# Patient Record
Sex: Female | Born: 1980 | Race: White | Hispanic: No | Smoking: Never smoker
Health system: Southern US, Community
[De-identification: ages and names within clinical notes are randomized; demographics above are authoritative.]

## PROBLEM LIST (undated history)

## (undated) DIAGNOSIS — C569 Malignant neoplasm of unspecified ovary: Secondary | ICD-10-CM

## (undated) DIAGNOSIS — C801 Malignant (primary) neoplasm, unspecified: Secondary | ICD-10-CM

## (undated) DIAGNOSIS — F32A Depression, unspecified: Secondary | ICD-10-CM

## (undated) DIAGNOSIS — F329 Major depressive disorder, single episode, unspecified: Secondary | ICD-10-CM

## (undated) DIAGNOSIS — R1013 Epigastric pain: Secondary | ICD-10-CM

## (undated) DIAGNOSIS — K219 Gastro-esophageal reflux disease without esophagitis: Secondary | ICD-10-CM

## (undated) DIAGNOSIS — F419 Anxiety disorder, unspecified: Secondary | ICD-10-CM

## (undated) DIAGNOSIS — G4730 Sleep apnea, unspecified: Secondary | ICD-10-CM

## (undated) HISTORY — DX: Major depressive disorder, single episode, unspecified: F32.9

## (undated) HISTORY — DX: Depression, unspecified: F32.A

## (undated) HISTORY — DX: Epigastric pain: R10.13

## (undated) HISTORY — PX: ABDOMINAL HYSTERECTOMY: SHX81

## (undated) HISTORY — DX: Malignant (primary) neoplasm, unspecified: C80.1

## (undated) HISTORY — DX: Sleep apnea, unspecified: G47.30

## (undated) HISTORY — DX: Anxiety disorder, unspecified: F41.9

## (undated) HISTORY — PX: HERNIA REPAIR: SHX51

## (undated) HISTORY — PX: APPENDECTOMY: SHX54

## (undated) HISTORY — DX: Malignant neoplasm of unspecified ovary: C56.9

## (undated) HISTORY — DX: Gastro-esophageal reflux disease without esophagitis: K21.9

---
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
## 2000-03-27 ENCOUNTER — Encounter: Admission: RE | Admit: 2000-03-27 | Discharge: 2000-06-25 | Payer: Self-pay | Admitting: Orthopaedic Surgery

## 2003-01-22 ENCOUNTER — Encounter: Payer: Self-pay | Admitting: Unknown Physician Specialty

## 2003-01-22 ENCOUNTER — Ambulatory Visit (HOSPITAL_COMMUNITY): Admission: RE | Admit: 2003-01-22 | Discharge: 2003-01-22 | Payer: Self-pay | Admitting: Unknown Physician Specialty

## 2004-08-08 ENCOUNTER — Ambulatory Visit (HOSPITAL_COMMUNITY): Payer: Self-pay | Admitting: Psychiatry

## 2004-11-21 ENCOUNTER — Ambulatory Visit: Payer: Self-pay | Admitting: Psychiatry

## 2004-11-21 ENCOUNTER — Ambulatory Visit (HOSPITAL_COMMUNITY): Payer: Self-pay | Admitting: Psychiatry

## 2005-01-18 ENCOUNTER — Encounter: Payer: Self-pay | Admitting: Cardiology

## 2005-03-01 ENCOUNTER — Ambulatory Visit: Payer: Self-pay | Admitting: Psychiatry

## 2005-03-01 ENCOUNTER — Ambulatory Visit (HOSPITAL_COMMUNITY): Payer: Self-pay | Admitting: Psychiatry

## 2005-05-17 ENCOUNTER — Ambulatory Visit (HOSPITAL_COMMUNITY): Payer: Self-pay | Admitting: Psychiatry

## 2005-09-13 ENCOUNTER — Ambulatory Visit (HOSPITAL_COMMUNITY): Payer: Self-pay | Admitting: Physician Assistant

## 2005-10-13 ENCOUNTER — Ambulatory Visit (HOSPITAL_COMMUNITY): Payer: Self-pay | Admitting: Psychiatry

## 2005-12-13 ENCOUNTER — Ambulatory Visit (HOSPITAL_COMMUNITY): Payer: Self-pay | Admitting: Psychiatry

## 2006-03-15 ENCOUNTER — Ambulatory Visit (HOSPITAL_COMMUNITY): Payer: Self-pay | Admitting: Psychiatry

## 2006-06-08 ENCOUNTER — Ambulatory Visit (HOSPITAL_COMMUNITY): Payer: Self-pay | Admitting: Psychiatry

## 2006-09-19 ENCOUNTER — Ambulatory Visit (HOSPITAL_COMMUNITY): Payer: Self-pay | Admitting: Psychiatry

## 2006-10-22 ENCOUNTER — Ambulatory Visit (HOSPITAL_COMMUNITY): Payer: Self-pay | Admitting: Psychiatry

## 2007-01-24 ENCOUNTER — Ambulatory Visit (HOSPITAL_COMMUNITY): Payer: Self-pay | Admitting: Psychiatry

## 2007-05-01 ENCOUNTER — Ambulatory Visit (HOSPITAL_COMMUNITY): Payer: Self-pay | Admitting: Psychiatry

## 2007-08-02 ENCOUNTER — Ambulatory Visit (HOSPITAL_COMMUNITY): Payer: Self-pay | Admitting: Psychiatry

## 2008-12-30 ENCOUNTER — Ambulatory Visit (HOSPITAL_COMMUNITY): Payer: Self-pay | Admitting: Psychiatry

## 2009-05-11 ENCOUNTER — Ambulatory Visit (HOSPITAL_COMMUNITY): Payer: Self-pay | Admitting: Psychiatry

## 2009-08-06 ENCOUNTER — Ambulatory Visit (HOSPITAL_COMMUNITY): Payer: Self-pay | Admitting: Psychiatry

## 2010-02-04 ENCOUNTER — Ambulatory Visit (HOSPITAL_COMMUNITY): Payer: Self-pay | Admitting: Psychiatry

## 2010-02-16 LAB — LIPID PANEL
Cholesterol: 158 mg/dL (ref 0–200)
HDL: 39 mg/dL (ref 35–70)
LDL Cholesterol: 86 mg/dL

## 2010-02-16 LAB — TSH: TSH: 0.98 u[IU]/mL (ref <=5.90)

## 2010-02-16 LAB — BASIC METABOLIC PANEL: Glucose: 90 mg/dL

## 2010-05-30 ENCOUNTER — Encounter: Payer: Self-pay | Admitting: Cardiology

## 2010-05-30 LAB — CBC AND DIFFERENTIAL
Hemoglobin: 12.1 g/dL (ref 12.0–16.0)
Platelets: 286 10*3/uL (ref 150–399)
WBC: 6.4 10^3/mL

## 2010-06-06 ENCOUNTER — Encounter: Payer: Self-pay | Admitting: Cardiology

## 2010-08-23 ENCOUNTER — Ambulatory Visit (HOSPITAL_COMMUNITY): Payer: Self-pay | Admitting: Psychiatry

## 2010-09-05 ENCOUNTER — Ambulatory Visit: Admit: 2010-09-05 | Payer: Self-pay | Admitting: Cardiology

## 2010-09-15 ENCOUNTER — Ambulatory Visit
Admission: RE | Admit: 2010-09-15 | Discharge: 2010-09-15 | Payer: Self-pay | Source: Home / Self Care | Attending: Cardiology | Admitting: Cardiology

## 2010-09-15 ENCOUNTER — Encounter: Payer: Self-pay | Admitting: Cardiology

## 2010-09-15 ENCOUNTER — Encounter (INDEPENDENT_AMBULATORY_CARE_PROVIDER_SITE_OTHER): Payer: Self-pay | Admitting: *Deleted

## 2010-09-15 DIAGNOSIS — F411 Generalized anxiety disorder: Secondary | ICD-10-CM | POA: Insufficient documentation

## 2010-09-15 DIAGNOSIS — K219 Gastro-esophageal reflux disease without esophagitis: Secondary | ICD-10-CM | POA: Insufficient documentation

## 2010-09-15 DIAGNOSIS — R002 Palpitations: Secondary | ICD-10-CM | POA: Insufficient documentation

## 2010-09-15 DIAGNOSIS — G4730 Sleep apnea, unspecified: Secondary | ICD-10-CM

## 2010-09-15 DIAGNOSIS — G4710 Hypersomnia, unspecified: Secondary | ICD-10-CM | POA: Insufficient documentation

## 2010-09-20 ENCOUNTER — Encounter: Payer: Self-pay | Admitting: Cardiology

## 2010-09-20 ENCOUNTER — Ambulatory Visit
Admission: RE | Admit: 2010-09-20 | Discharge: 2010-09-20 | Payer: Self-pay | Source: Home / Self Care | Attending: Cardiology | Admitting: Cardiology

## 2010-09-20 ENCOUNTER — Encounter (INDEPENDENT_AMBULATORY_CARE_PROVIDER_SITE_OTHER): Payer: Self-pay | Admitting: *Deleted

## 2010-09-21 ENCOUNTER — Telehealth (INDEPENDENT_AMBULATORY_CARE_PROVIDER_SITE_OTHER): Payer: Self-pay | Admitting: *Deleted

## 2010-09-23 ENCOUNTER — Encounter (INDEPENDENT_AMBULATORY_CARE_PROVIDER_SITE_OTHER): Payer: Self-pay | Admitting: *Deleted

## 2010-09-29 ENCOUNTER — Encounter: Payer: Self-pay | Admitting: Cardiology

## 2010-09-29 NOTE — Letter (Signed)
Summary: Work Writer at [employer]	[place]. [address]   [place], [place] [address]   Phone: [phone]  Fax: [phone]     September 15, 2010    Almira Bar   The above named patient had a medical visit today at:  [facility].  She will be having an Echo done on Tuesday, January 24 at 2:00 at [facility].  Immediatly following, she will be coming back to office to have a 48 hour holter moniter placed.  This monitor should not get wet.    Please take this into consideration when reviewing the time away from work/school.      Sincerely yours,  Architectural technologist

## 2010-09-29 NOTE — Letter (Signed)
Summary: External Correspondence/ [facility]  External Correspondence/ [facility]   Imported By: Dorise Hiss 09/15/2010 10:56:48  _____________________________________________________________________  External Attachment:    Type:   Image     Comment:   External Document

## 2010-09-29 NOTE — Letter (Signed)
Summary: External Correspondence/ [facility]  External Correspondence/ [facility]   Imported By: Dorise Hiss 08/18/2010 10:09:08  _____________________________________________________________________  External Attachment:    Type:   Image     Comment:   External Document

## 2010-09-29 NOTE — Letter (Signed)
Summary: External Correspondence/ [facility]  External Correspondence/ [facility]   Imported By: Dorise Hiss 09/15/2010 10:58:29  _____________________________________________________________________  External Attachment:    Type:   Image     Comment:   External Document

## 2010-09-29 NOTE — Letter (Signed)
Summary: Eden Results Veterinary surgeon at [facility]. [address]   [place], [place] [address]   Phone: [phone]  Fax: [phone]        September 23, 2010 MRN: [id number]    [facility] [address] [place], [place]  [address]   Dear Ms. Bacha,  Your test ordered by Selena Batten has been reviewed by your physician (or physician assistant) and was found to be normal or stable. Your physician (or physician assistant) felt no changes were needed at this time.  __X__ Echocardiogram  ____ Cardiac Stress Test  ____ Lab Work  ____ Peripheral vascular study of arms, legs or neck  ____ CT scan or X-ray  ____ Lung or Breathing test  ____ Other:   Thank you.   Cyril Loosen, RN, BSN    Duane Boston, M.D., F.A.C.C. Thressa Sheller, M.D., F.A.C.C. Oneal Grout, M.D., F.A.C.C. Cheree Ditto, M.D., F.A.C.C. Daiva Nakayama, M.D., F.A.C.C. Kenney Houseman, M.D., F.A.C.C. Jeanne Ivan, PA-C

## 2010-09-29 NOTE — Assessment & Plan Note (Signed)
Summary: NP-PALPITATIONS   Visit Type:  Initial Consult Primary Provider:  Dr. Kyra Manges   History of Present Illness: 30 year old woman referred for cardiology consultation.  Faxed copy of ECG from October 2011 shows a sinus rhythm with normal intervals.  Patient presents with no prior history of heart disease, no prior diagnostic testing. She also presents with no known cardiac risk factors.  Her complaint is that of new onset, daily palpitations, at times referred to as "fluttering", with some associated dyspnea, but no chest pain or presyncope. She does feel somewhat fatigued, afterwards. These episodes occur with or without activity, are brief in duration, but recurrent throughout the day.  Of note, she admits to imbibing a significant amount of [facility] daily, perhaps 40 ounces, as well as drinking sweet tea.  Preventive Screening-Counseling & Management  Alcohol-Tobacco     Smoking Status: never  Current Medications (verified): 1)  Effexor Xr 150 Mg Xr24h-Cap (Venlafaxine Hcl) .... Take 1 Tablet By Mouth Once A Day  Allergies (verified): 1)  ! Codeine  Past History:  Family History: Last updated: 09/15/2010 negative for premature CAD  Past Medical History: Anxiety disorder G E R D  Family History: negative for premature CAD  Social History: Smoking Status:  never  Review of Systems       No fevers, chills, hemoptysis, dysphagia, melena, hematocheezia, hematuria, rash, claudication, orthopnea, pnd, pedal edema. All other systems negative.   Vital Signs:  Patient profile:   30 year old female Height:      66 inches Weight:      200 pounds BMI:     32.40 Pulse rate:   98 / minute BP sitting:   119 / 76  (left arm) Cuff size:   regular  Vitals Entered By: Hoover Brunette, LPN (September 15, 2010 2:52 PM)  Nutrition Counseling: Patient's BMI is greater than 25 and therefore counseled on weight management options.  Is Patient Diabetic? No Comments  palpitations x 2 month off/on-   Does notice that it seems to calm down if she drinks glass of wine at bedtime.  But will somethimes wake up in middle of night.  Has also tried cutting back on caffeine.   Was having alot of reflux.  Did have upper endoscopy x 1 month ago & was told normal.  EKG scanned in from [place].     Physical Exam  Additional Exam:  GEN: 30 year old female, moderately obese, no distress HEENT: NCAT,PERRLA,EOMI NECK: palpable pulses, no bruits; no JVD; no TM LUNGS: CTA bilaterally HEART: RRR (S1S2); no significant murmurs; no rubs; no gallops ABD: soft, NT; intact BS EXT: intact distal pulses; no edema SKIN: warm, dry MUSC: no obvious deformity NEURO: A/O (x3)     EKG  Procedure date:  06/06/2010  Findings:      a 12-lead EKG from [facility] indicates NSR at 78 bpm; normal axis; no ischemic changes  Impression & Recommendations:  Problem # 1:  PALPITATIONS (ICD-785.1)  Will evaluate further with a baseline 2-D echocardiogram for assessment of LV function, and rule out of any underlying structural abnormalities. We'll also order a 48 hour Holter monitor to rule out dysrhythmia. We suspect that she most likely has frequent PVCs, or perhaps PACs. We also strongly recommend that she significantly curtail her caffeinated beverage intake. We'll also check a baseline metabolic profile, as well as a TSH level. We'll then arrange for early clinic followup with myself and Dr. Diona Browner, within the next few weeks, for review of  these results and further recommendations.  Orders: 2-D Echocardiogram (2D Echo) Holter Monitor (Holter Monitor) T-Basic Metabolic Panel [phone]) T-TSH [phone])  Problem # 2:  ANXIETY DISORDER (ICD-300.00) Assessment: Comment Only  Problem # 3:  ESOPHAGEAL REFLUX (ICD-530.81) Assessment: Comment Only  Patient Instructions: 1)  Follow up with Dr. Diona Browner on Thursday, October 20, 2010 at 3pm. 2)  Your physician has  requested that you have an echocardiogram.  Echocardiography is a painless test that uses sound waves to create images of your heart. It provides your doctor with information about the size and shape of your heart and how well your heart's chambers and valves are working.  This procedure takes approximately one hour. There are no restrictions for this procedure. 3)  Your physician has recommended that you wear a holter monitor.  Holter monitors are medical devices that record the heart's electrical activity. Doctors most often use these monitors to diagnose arrhythmias. Arrhythmias are problems with the speed or rhythm of the heartbeat. The monitor is a small, portable device. You can wear one while you do your normal daily activities. This is usually used to diagnose what is causing palpitations/syncope (passing out). 4)  Your physician recommends that you go to the [facility] for lab work.

## 2010-09-29 NOTE — Progress Notes (Signed)
Summary: Requesting work note  Phone Note Call from Patient Call back at [employer] [phone] Call back at [phone]   Summary of Call: Pt called to ask if she could get a note excusing her from work for Thursday and Friday. She states she is wearing holter. 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 She states even if she works on the floor the monitor will get wet because she sweats like a pig. Pt was given a note from [place] stating that she will be wearing a monitor that cannot get wet. Pt notified that sweat should not damage the monitor. Pt notified as long as the monitor is not submersed in water it will be okay. She was notified to keep monitor in the case and keep as dry as possible. Pt verbalized understanding.  Initial call taken by: Cyril Loosen, RN, BSN,  September 21, 2010 5:06 PM

## 2010-09-29 NOTE — Letter (Signed)
Summary: Eden Results Engineer, manufacturing at [facility]  [address]   [place], [place] [address]   Phone: [phone]  Fax: [phone]        September 20, 2010 MRN: [id number]    [facility] [address] [place], [place]  [address]    Dear Ms. Brackney,  Your test ordered by Selena Batten has been reviewed by your physician (or physician assistant) and was found to be normal or stable. Your physician (or physician assistant) felt no changes were needed at this time.  ____ Echocardiogram  ____ Cardiac Stress Test  __X__ Lab Work  ____ Peripheral vascular study of arms, legs or neck  ____ CT scan or X-ray  ____ Lung or Breathing test  ____ Other:   Thank you.   Cyril Loosen, RN, BSN    Duane Boston, M.D., F.A.C.C. Thressa Sheller, M.D., F.A.C.C. Oneal Grout, M.D., F.A.C.C. Cheree Ditto, M.D., F.A.C.C. Daiva Nakayama, M.D., F.A.C.C. Kenney Houseman, M.D., F.A.C.C. Jeanne Ivan, PA-C

## 2010-10-05 NOTE — Procedures (Addendum)
Summary: Holter Final Report  Holter Final Report   Imported By: Cyril Loosen, RN, BSN 09/29/2010 10:27:17  _____________________________________________________________________  External Attachment:    Type:   Image     Comment:   External Document  Appended Document: Holter Final Report Pt called today asking for results of monitor. Pt was to be notified at 2/23 office visit but no showed for this appt. She states it costs $40 to come for appt and didn't want to pay if everything was okay. Notified of results. Pt will contact our office if she feels she needs further f/u in the future.

## 2010-10-20 ENCOUNTER — Ambulatory Visit: Payer: Self-pay | Admitting: Cardiology

## 2010-11-15 ENCOUNTER — Encounter: Payer: Self-pay | Admitting: *Deleted

## 2010-11-15 DIAGNOSIS — F419 Anxiety disorder, unspecified: Secondary | ICD-10-CM

## 2010-11-29 ENCOUNTER — Telehealth: Payer: Self-pay | Admitting: *Deleted

## 2010-11-29 NOTE — Telephone Encounter (Signed)
Pt left message on voicemail asking for return call. She did not give a reason for calling.  Attempted to reach pt. Left message at [phone] for pt to return call. No answer at [phone].

## 2010-12-06 NOTE — Telephone Encounter (Signed)
Left message to call back on voice mail

## 2010-12-15 NOTE — Telephone Encounter (Signed)
Pt left original message asking for call. Attempted to reach pt several times. Pt never returned call.

## 2011-03-02 ENCOUNTER — Encounter: Payer: Self-pay | Admitting: Physician Assistant

## 2012-09-27 ENCOUNTER — Ambulatory Visit (HOSPITAL_COMMUNITY): Payer: Self-pay | Admitting: Psychiatry

## 2012-11-22 ENCOUNTER — Ambulatory Visit: Payer: Self-pay | Admitting: Physician Assistant

## 2012-11-22 ENCOUNTER — Ambulatory Visit: Payer: Self-pay | Admitting: General Practice

## 2012-11-22 ENCOUNTER — Ambulatory Visit: Payer: Self-pay | Admitting: Family Medicine

## 2012-12-02 ENCOUNTER — Other Ambulatory Visit: Payer: Self-pay | Admitting: Nurse Practitioner

## 2012-12-02 NOTE — Telephone Encounter (Signed)
Last filled 11/05/12, last seen 08/16/12. If approved, [facility]

## 2012-12-02 NOTE — Telephone Encounter (Signed)
Call in Rx for xanax please

## 2012-12-02 NOTE — Telephone Encounter (Signed)
[facility]

## 2012-12-04 ENCOUNTER — Telehealth: Payer: Self-pay | Admitting: Family Medicine

## 2012-12-04 NOTE — Telephone Encounter (Signed)
APPT MADE

## 2012-12-05 ENCOUNTER — Encounter: Payer: Self-pay | Admitting: Family Medicine

## 2012-12-05 ENCOUNTER — Ambulatory Visit (INDEPENDENT_AMBULATORY_CARE_PROVIDER_SITE_OTHER): Payer: Managed Care, Other (non HMO) | Admitting: Family Medicine

## 2012-12-05 VITALS — BP 128/80 | HR 84 | Temp 97.2°F | Resp 18 | Ht 65.0 in | Wt 214.8 lb

## 2012-12-05 DIAGNOSIS — R6889 Other general symptoms and signs: Secondary | ICD-10-CM | POA: Insufficient documentation

## 2012-12-05 DIAGNOSIS — R5383 Other fatigue: Secondary | ICD-10-CM | POA: Insufficient documentation

## 2012-12-05 DIAGNOSIS — R5381 Other malaise: Secondary | ICD-10-CM

## 2012-12-05 DIAGNOSIS — F411 Generalized anxiety disorder: Secondary | ICD-10-CM | POA: Insufficient documentation

## 2012-12-05 DIAGNOSIS — K219 Gastro-esophageal reflux disease without esophagitis: Secondary | ICD-10-CM | POA: Insufficient documentation

## 2012-12-05 LAB — POCT CBC
Granulocyte percent: 70.4 %G (ref 37–80)
HCT, POC: 38 % (ref 37.7–47.9)
Hemoglobin: 13.2 g/dL (ref 12.2–16.2)
Lymph, poc: 2.2 (ref 0.6–3.4)
MCH, POC: 28.7 pg (ref 27–31.2)
MCHC: 34.6 g/dL (ref 31.8–35.4)
MCV: 82.9 fL (ref 80–97)
MPV: 7.5 fL (ref 0–99.8)
POC Granulocyte: 7 — AB (ref 2–6.9)
POC LYMPH PERCENT: 22.2 %L (ref 10–50)
Platelet Count, POC: 285 10*3/uL (ref 142–424)
RBC: 4.6 M/uL (ref 4.04–5.48)
RDW, POC: 15.2 %
WBC: 10 10*3/uL (ref 4.6–10.2)

## 2012-12-05 MED ORDER — VORTIOXETINE HBR 10 MG PO TABS: 10.0000 mg | ORAL_TABLET | Freq: Every day | ORAL | Status: DC

## 2012-12-05 MED ORDER — OMEPRAZOLE 40 MG PO CPDR: 40.0000 mg | DELAYED_RELEASE_CAPSULE | Freq: Every day | ORAL | Status: DC

## 2012-12-05 NOTE — Patient Instructions (Addendum)

## 2012-12-05 NOTE — Progress Notes (Signed)
Patient ID: Mary Cox, female   DOB: 08/20/1981, 32 y.o.   MRN: [id number] SUBJECTIVE:   HPI: Patient comes in for evaluation of cold intolerance, especially her right side. Of the  body. Also, she would like a different antidepressant. Thinks it has side effects. These are headaches. In malaise. 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 She has her family. She is married with a child. Her pedal anxiety is that the cancer would come back. She is been cancer free for 2-3 years. She has been doing well. He gets anxious easily ever since the surgery. Has had to use Xanax. She would like a different antidepressant.  Chest lateral reflux. Sometimes she number fluid in her throat.  PMH/PSH: reviewed/updated in Epic  SH/FH: reviewed/updated in Epic  Allergies: reviewed/updated in Epic  Medications: reviewed/updated in Epic  Immunizations: reviewed/updated in Epic  ROS: As above in the HPI. All other systems are stable or negative.  OBJECTIVE:    On examination she appeared in good health and spirits. Obese Vital signs as documented. BP 128/80  Pulse 84  Temp(Src) 97.2 F (36.2 C)  Resp 18  Ht 5\' 5"  (1.651 m)  Wt 214 lb 12.8 oz (97.433 kg)  BMI 35.74 kg/m2  SpO2 99%  Skin warm and dry and without overt rashes.  Head, Eyes, Ears, throat: normal Neck without JVD.  Lungs clear.  Heart exam notable for regular rhythm, normal sounds and absence of murmurs, rubs or gallops. Abdomen unremarkable and without evidence of organomegaly, masses, or abdominal aortic enlargement.  xtremities nonedematous. Neurologic: nonfocal  ASSESSMENT:  Other malaise and fatigue - Plan: POCT CBC, COMPLETE METABOLIC PANEL WITH GFR, Vitamin B12, Thyroid Panel With TSH, Vitamin D 25 hydroxy, Folate  Cold intolerance - Plan: POCT CBC, COMPLETE METABOLIC PANEL WITH GFR, Vitamin B12, Thyroid Panel With TSH, Vitamin D 25 hydroxy, Folate  GERD (gastroesophageal reflux disease)  Generalized  anxiety disorder  her anxiety stems from her hysterectomy. Deep down some times, She thinks about the cancer coming back.   PLAN: Orders Placed This Encounter  Procedures  . COMPLETE METABOLIC PANEL WITH GFR  . Vitamin B12  . Thyroid Panel With TSH  . Vitamin D 25 hydroxy  . Folate  . POCT CBC   Results for orders placed in visit on 12/05/12 (from the past 24 hour(s))  POCT CBC     Status: Abnormal   Collection Time    12/05/12  5:06 PM      Result Value Range   WBC 10.0  4.6 - 10.2 K/uL   Lymph, poc 2.2  0.6 - 3.4   POC LYMPH PERCENT 22.2  10 - 50 %L   MID (cbc)    0 - 0.9   POC MID %    0 - 12 %M   POC Granulocyte 7.0 (*) 2 - 6.9   Granulocyte percent 70.4  37 - 80 %G   RBC 4.6  4.04 - 5.48 M/uL   Hemoglobin 13.2  12.2 - 16.2 g/dL   HCT, POC 08.6  57.8 - 47.9 %   MCV 82.9  80 - 97 fL   MCH, POC 28.7  27 - 31.2 pg   MCHC 34.6  31.8 - 35.4 g/dL   RDW, POC 46.9     Platelet Count, POC 285.0  142 - 424 K/uL   MPV 7.5  0 - 99.8 fL   Meds ordered this encounter  Medications  . ALPRAZolam (XANAX) 0.5 MG tablet  Sig: Take 0.5 mg by mouth at bedtime as needed for sleep.  Marland Kitchen DISCONTD: venlafaxine XR (EFFEXOR-XR) 150 MG 24 hr capsule    Sig: Take 150 mg by mouth daily.  . Vortioxetine HBr (BRINTELLIX) 10 MG TABS    Sig: Take 10 mg by mouth daily.    Dispense:  30 tablet    Refill:  2  . omeprazole (PRILOSEC) 40 MG capsule    Sig: Take 1 capsule (40 mg total) by mouth daily.    Dispense:  30 capsule    Refill:  3   supportive therapy. Counseled patient on finding her happiness in her life and in survival of the cancer. Await lab work. But there are trial of a different antidepressant.,gave her  four-week samples brintellix. Liver coupon part. Also prescribed BRINTELLIX in Epic.  Healthy balanced lifestyle and healthy eating discussed  Discussed GERD. Steps to take to elevate her bed. Dietary changes. Weight loss.   Elizar Alpern P. Modesto Charon, M.D.

## 2012-12-06 LAB — COMPLETE METABOLIC PANEL WITH GFR
ALT: 23 U/L (ref 0–35)
AST: 16 U/L (ref 0–37)
Albumin: 4.2 g/dL (ref 3.5–5.2)
Alkaline Phosphatase: 125 U/L — ABNORMAL HIGH (ref 39–117)
BUN: 8 mg/dL (ref 6–23)
CO2: 29 mEq/L (ref 19–32)
Calcium: 9.2 mg/dL (ref 8.4–10.5)
Chloride: 102 mEq/L (ref 96–112)
Creat: 0.82 mg/dL (ref 0.50–1.10)
GFR, Est African American: >89 mL/min
GFR, Est Non African American: >89 mL/min
Glucose, Bld: 90 mg/dL (ref 70–99)
Potassium: 3.8 mEq/L (ref 3.5–5.3)
Sodium: 138 mEq/L (ref 135–145)
Total Bilirubin: 0.3 mg/dL (ref 0.3–1.2)
Total Protein: 7.3 g/dL (ref 6.0–8.3)

## 2012-12-06 LAB — FOLATE: Folate: >20 ng/mL

## 2012-12-06 LAB — VITAMIN D 25 HYDROXY (VIT D DEFICIENCY, FRACTURES): Vit D, 25-Hydroxy: 30 ng/mL (ref 30–89)

## 2012-12-06 LAB — VITAMIN B12: Vitamin B-12: 345 pg/mL (ref 211–911)

## 2012-12-06 LAB — THYROID PANEL WITH TSH
Free Thyroxine Index: 2 (ref 1.0–3.9)
T3 Uptake: 30.5 % (ref 22.5–37.0)
T4, Total: 6.5 ug/dL (ref 5.0–12.5)
TSH: 0.816 u[IU]/mL (ref 0.350–4.500)

## 2012-12-06 NOTE — Progress Notes (Signed)
Quick Note:  Call patient. Labs normal. No change in plan. ______ 

## 2012-12-09 ENCOUNTER — Ambulatory Visit (HOSPITAL_COMMUNITY): Payer: Self-pay | Admitting: Psychiatry

## 2012-12-09 ENCOUNTER — Encounter: Payer: Self-pay | Admitting: Physician Assistant

## 2012-12-29 ENCOUNTER — Other Ambulatory Visit: Payer: Self-pay | Admitting: Nurse Practitioner

## 2012-12-30 NOTE — Telephone Encounter (Signed)
RX sent to pharmacy  

## 2012-12-30 NOTE — Telephone Encounter (Signed)
Patient last seen in office on 08-16-12. Please advise. Thank  You. If approved please have nurse phone in to [facility]. Thank you

## 2012-12-31 NOTE — Telephone Encounter (Signed)
Rx called to [facility] in  [place]

## 2013-01-03 ENCOUNTER — Ambulatory Visit: Payer: Managed Care, Other (non HMO) | Admitting: Family Medicine

## 2013-01-21 ENCOUNTER — Other Ambulatory Visit: Payer: Self-pay | Admitting: Nurse Practitioner

## 2013-01-22 NOTE — Telephone Encounter (Signed)
She has 2 different xanax doses, chart sent back, [facility], chart sent fback

## 2013-01-23 ENCOUNTER — Other Ambulatory Visit: Payer: Self-pay | Admitting: Nurse Practitioner

## 2013-01-27 NOTE — Telephone Encounter (Signed)
Needs office visit. Did not come in for her 4 week recheck

## 2013-02-06 ENCOUNTER — Other Ambulatory Visit: Payer: Self-pay | Admitting: Family Medicine

## 2013-02-20 ENCOUNTER — Other Ambulatory Visit: Payer: Self-pay | Admitting: Family Medicine

## 2013-02-21 ENCOUNTER — Other Ambulatory Visit: Payer: Self-pay | Admitting: Family Medicine

## 2013-02-21 NOTE — Telephone Encounter (Signed)
Patient last seen on 4-10. Only requesting #15. Please review refills. If approved please have nurse phone in to pharmacy

## 2013-02-21 NOTE — Telephone Encounter (Signed)
Medication called in 

## 2013-02-21 NOTE — Telephone Encounter (Signed)
Please call in xanax with 2 refills 

## 2013-02-24 NOTE — Telephone Encounter (Signed)
Pt aware of meds 

## 2013-02-24 NOTE — Telephone Encounter (Signed)
What does patient need- Xanax was done on 02/20/13

## 2013-02-27 ENCOUNTER — Ambulatory Visit: Payer: Managed Care, Other (non HMO) | Admitting: Family Medicine

## 2013-03-21 ENCOUNTER — Other Ambulatory Visit: Payer: Self-pay | Admitting: Family Medicine

## 2013-04-17 ENCOUNTER — Other Ambulatory Visit: Payer: Self-pay | Admitting: Family Medicine

## 2013-04-17 NOTE — Telephone Encounter (Signed)
Prescription renewed in EPIC. Due for office visit 

## 2013-04-17 NOTE — Telephone Encounter (Signed)
Last seen 12/05/12  FPW  Last filled 03/21/13

## 2013-04-17 NOTE — Telephone Encounter (Signed)
Pt aware rx filled and will need ov prior to next refill

## 2013-04-22 ENCOUNTER — Ambulatory Visit (INDEPENDENT_AMBULATORY_CARE_PROVIDER_SITE_OTHER): Payer: Managed Care, Other (non HMO) | Admitting: General Practice

## 2013-04-22 ENCOUNTER — Ambulatory Visit (INDEPENDENT_AMBULATORY_CARE_PROVIDER_SITE_OTHER): Payer: Managed Care, Other (non HMO)

## 2013-04-22 VITALS — BP 135/79 | HR 106 | Temp 98.5°F | Ht 65.0 in | Wt 218.0 lb

## 2013-04-22 DIAGNOSIS — R109 Unspecified abdominal pain: Secondary | ICD-10-CM

## 2013-04-22 DIAGNOSIS — K5900 Constipation, unspecified: Secondary | ICD-10-CM

## 2013-04-22 LAB — POCT CBC
Lymph, poc: 1 (ref 0.6–3.4)
MCH, POC: 26.5 pg — AB (ref 27–31.2)
MCHC: 33.4 g/dL (ref 31.8–35.4)
MPV: 7.3 fL (ref 0–99.8)
POC LYMPH PERCENT: 14.6 %L (ref 10–50)
Platelet Count, POC: 247 10*3/uL (ref 142–424)
RBC: 4.8 M/uL (ref 4.04–5.48)

## 2013-04-22 LAB — POCT URINALYSIS DIPSTICK
Nitrite, UA: NEGATIVE
Urobilinogen, UA: NEGATIVE
pH, UA: 5

## 2013-04-22 LAB — POCT UA - MICROSCOPIC ONLY
Casts, Ur, LPF, POC: NEGATIVE
Crystals, Ur, HPF, POC: NEGATIVE

## 2013-04-22 NOTE — Progress Notes (Signed)
  Subjective:    Patient ID: Mary Cox, female    DOB: 12/08/1980, 32 y.o.   MRN: [id number]  Abdominal Pain This is a new problem. The current episode started yesterday. The onset quality is sudden. The problem occurs constantly. The problem has been gradually worsening. The pain is located in the generalized abdominal region. The pain is at a severity of 8/10. The quality of the pain is aching. The abdominal pain does not radiate. Associated symptoms include headaches and nausea. Pertinent negatives include no constipation, dysuria, fever or hematuria. Associated symptoms comments: Reports fever 101 at home. The pain is aggravated by movement and palpation. The pain is relieved by nothing. She has tried nothing for the symptoms. ovarian cancer in 2012  Headache  This is a new problem. The current episode started 1 to 4 weeks ago. The problem occurs daily. The problem has been unchanged. The pain is located in the frontal and temporal region. The pain does not radiate. The pain quality is similar to prior headaches. The quality of the pain is described as aching. Associated symptoms include abdominal pain and nausea. Pertinent negatives include no back pain, dizziness, eye pain, fever, hearing loss, neck pain, scalp tenderness, sinus pressure, visual change or weakness. Nothing aggravates the symptoms. She has tried acetaminophen for the symptoms. The treatment provided moderate relief. Her past medical history is significant for cancer. (Ovarian cancer in 2012)      Review of Systems  Constitutional: Negative for fever and chills.  HENT: Negative for hearing loss, neck pain and sinus pressure.   Eyes: Negative for pain.  Respiratory: Negative for chest tightness and shortness of breath.   Cardiovascular: Negative for chest pain and palpitations.  Gastrointestinal: Positive for nausea and abdominal pain. Negative for constipation.  Genitourinary: Negative for dysuria, hematuria and difficulty  urinating.  Musculoskeletal: Negative for back pain.  Neurological: Negative for dizziness and weakness.       Objective:   Physical Exam  Constitutional: She is oriented to person, place, and time. She appears well-developed and well-nourished.  Cardiovascular: Normal rate, regular rhythm and normal heart sounds.   Pulmonary/Chest: Effort normal and breath sounds normal.  Abdominal: Soft. Bowel sounds are normal. She exhibits no distension. There is tenderness. There is no rebound.  Neurological: She is alert and oriented to person, place, and time.  Skin: Skin is warm and dry.  Psychiatric: She has a normal mood and affect.   [facility] reading (PRIMARY) by Coralie Keens, FNP-C, constipation noted.                                         Assessment & Plan:  1. Abdominal  pain, other specified site - POCT CBC - DG Abd 1 View; Future - POCT urinalysis dipstick - POCT UA - Microscopic Only  2. Unspecified constipation -information sheets provided and discussed about constipation -Miralax 17grams daily, for 1-4 days, until bowel movement  Increase fluid intake (water) Increase fiber in diet (fruits, vegetables, whole grains) Take stool softner daily May take continue to take tylenol or nsaids for periodic headaches, will refer to specialist if worsen or unresolved RTO if symptoms worsen or unresolved, may seek emergency medical treatment  Patient verbalized understanding Coralie Keens, FNP-C

## 2013-04-22 NOTE — Patient Instructions (Addendum)

## 2013-05-02 ENCOUNTER — Telehealth: Payer: Self-pay | Admitting: *Deleted

## 2013-05-02 NOTE — Telephone Encounter (Signed)
Pt wants results from urine and cbc done 8/26- please address and have nurse call pt.

## 2013-05-11 ENCOUNTER — Other Ambulatory Visit: Payer: Self-pay | Admitting: General Practice

## 2013-05-11 NOTE — Telephone Encounter (Signed)
Spoke with patient's husband and informed of abdominal xray report (constipation) and urine lab results. He verbalized understanding and agreed to inform his wife.

## 2013-05-12 ENCOUNTER — Other Ambulatory Visit: Payer: Self-pay | Admitting: Family Medicine

## 2013-05-13 ENCOUNTER — Other Ambulatory Visit: Payer: Self-pay | Admitting: Nurse Practitioner

## 2013-05-14 NOTE — Telephone Encounter (Signed)
LAST OV 4/14. LAST RF 04/18/13. CALL IN [facility].

## 2013-05-14 NOTE — Telephone Encounter (Signed)
Called in.

## 2013-05-14 NOTE — Telephone Encounter (Signed)
Please call in xanax

## 2013-05-19 ENCOUNTER — Encounter: Payer: Self-pay | Admitting: Nurse Practitioner

## 2013-06-06 ENCOUNTER — Other Ambulatory Visit: Payer: Managed Care, Other (non HMO) | Admitting: General Practice

## 2013-06-09 ENCOUNTER — Other Ambulatory Visit: Payer: Self-pay | Admitting: Nurse Practitioner

## 2013-06-10 NOTE — Telephone Encounter (Signed)
Last seen 12/05/12  FPW  If approved route to nurse to [facility] into [facility] 

## 2013-06-12 NOTE — Telephone Encounter (Signed)
[facility] of [facility]ill below

## 2013-06-12 NOTE — Telephone Encounter (Signed)
Patient needs to be seen. Has exceeded time since last visit. Limited quantity refilled.please call in Needs to bring all medications to next appointment.

## 2013-07-01 ENCOUNTER — Telehealth: Payer: Self-pay | Admitting: Nurse Practitioner

## 2013-07-01 MED ORDER — ALPRAZOLAM 0.5 MG PO TABS: 0.5000 mg | ORAL_TABLET | Freq: Two times a day (BID) | ORAL | Status: DC | PRN

## 2013-07-01 NOTE — Telephone Encounter (Signed)
Please call in xanax 0.5 mg BID #60 0 refills

## 2013-07-02 NOTE — Telephone Encounter (Signed)
[facility]ax called into [facility].

## 2013-07-02 NOTE — Telephone Encounter (Signed)
Message left on vm 

## 2013-07-08 ENCOUNTER — Other Ambulatory Visit: Payer: Self-pay

## 2013-07-08 ENCOUNTER — Telehealth: Payer: Self-pay | Admitting: Nurse Practitioner

## 2013-07-08 MED ORDER — VENLAFAXINE HCL ER 150 MG PO CP24: 150.0000 mg | ORAL_CAPSULE | Freq: Every day | ORAL | Status: DC

## 2013-07-31 ENCOUNTER — Encounter: Payer: Managed Care, Other (non HMO) | Admitting: Nurse Practitioner

## 2013-07-31 NOTE — Progress Notes (Signed)
   Subjective:    Patient ID: Mary Cox, female    DOB: 1981-05-07, 32 y.o.   MRN: [id number]  HPI    Review of Systems     Objective:   Physical Exam        Assessment & Plan:  No show

## 2013-08-08 ENCOUNTER — Other Ambulatory Visit: Payer: Self-pay | Admitting: Nurse Practitioner

## 2013-08-08 MED ORDER — VENLAFAXINE HCL ER 150 MG PO CP24: 150.0000 mg | ORAL_CAPSULE | Freq: Every day | ORAL | Status: DC

## 2013-08-08 NOTE — Telephone Encounter (Signed)
Call patient : due for follow up Prescription refilled & sent to pharmacy in [place].

## 2013-08-08 NOTE — Telephone Encounter (Signed)
Last seen 12/09/12

## 2013-08-11 NOTE — Telephone Encounter (Signed)
Pt notified and verbalized understanding.

## 2013-08-27 ENCOUNTER — Other Ambulatory Visit: Payer: Self-pay | Admitting: Nurse Practitioner

## 2013-09-01 NOTE — Telephone Encounter (Signed)
Last seen 12/05/12  FPW  If approved route to nurse to call into [facility]

## 2013-09-02 NOTE — Telephone Encounter (Signed)
Rx ready for nurse to Phone in.limited quantity. Past due for follow up

## 2013-09-02 NOTE — Telephone Encounter (Addendum)
Received call from [place] at [facility] in [place] that [facility] filled a prescription for Xanax #60 yesterday. It was written by her psychiatrist, Dr. Abbey Chatters.  She should have him fill this medication from now on. Pharmacy is going to disregard our refill.

## 2013-09-02 NOTE — Addendum Note (Signed)
Addended by: Ilean China on: 09/02/2013 02:31 PM   Modules accepted: Medications

## 2013-09-02 NOTE — Telephone Encounter (Signed)
Left refill [facility] on [facility] voicemail.

## 2013-10-01 ENCOUNTER — Telehealth: Payer: Self-pay | Admitting: General Practice

## 2013-10-01 NOTE — Telephone Encounter (Signed)
Patient aware we havent received the ct scan yet and she might want to get a copy and run it by here

## 2013-10-02 ENCOUNTER — Telehealth: Payer: Self-pay | Admitting: Nurse Practitioner

## 2013-10-03 NOTE — Telephone Encounter (Signed)
Patient aware that the CT done over next to [facility] has not arrived here yet.  She said they told her everything looked fine except there was a spot that [facility] may do further testing or not.  She will call to have results faxed.

## 2013-10-06 ENCOUNTER — Telehealth: Payer: Self-pay | Admitting: Nurse Practitioner

## 2013-10-10 ENCOUNTER — Telehealth: Payer: Self-pay | Admitting: General Practice

## 2013-10-10 ENCOUNTER — Ambulatory Visit: Payer: Managed Care, Other (non HMO) | Admitting: General Practice

## 2013-10-10 NOTE — Telephone Encounter (Signed)
Appt today at 4:10

## 2013-11-17 ENCOUNTER — Encounter: Payer: Self-pay | Admitting: Nurse Practitioner

## 2013-11-17 ENCOUNTER — Ambulatory Visit (INDEPENDENT_AMBULATORY_CARE_PROVIDER_SITE_OTHER): Payer: Managed Care, Other (non HMO)

## 2013-11-17 ENCOUNTER — Ambulatory Visit (INDEPENDENT_AMBULATORY_CARE_PROVIDER_SITE_OTHER): Payer: Managed Care, Other (non HMO) | Admitting: Nurse Practitioner

## 2013-11-17 VITALS — BP 130/69 | HR 86 | Temp 96.4°F | Ht 65.0 in | Wt 242.0 lb

## 2013-11-17 DIAGNOSIS — M545 Low back pain, unspecified: Secondary | ICD-10-CM

## 2013-11-17 DIAGNOSIS — M79609 Pain in unspecified limb: Secondary | ICD-10-CM

## 2013-11-17 DIAGNOSIS — M79601 Pain in right arm: Secondary | ICD-10-CM

## 2013-11-17 MED ORDER — CYCLOBENZAPRINE HCL 10 MG PO TABS: 10.0000 mg | ORAL_TABLET | Freq: Three times a day (TID) | ORAL | Status: DC | PRN

## 2013-11-17 MED ORDER — MELOXICAM 15 MG PO TABS: 15.0000 mg | ORAL_TABLET | Freq: Every day | ORAL | Status: DC

## 2013-11-17 NOTE — Progress Notes (Signed)
   Subjective:    Patient ID: Mary Cox, female    DOB: [birth year], 33 y.o.   MRN: [id number]  HPI Patient in c/o lower back pain that radiates down right leg- any house work at all increases pain. SAys  that when she lays down at night her right arm and leg start hurting- arm pain 5/10 and back pain 9/10- Hasn't taken anything- Has history of ovarian cancer.    Review of Systems  Constitutional: Negative.   HENT: Negative.   Respiratory: Negative.   Cardiovascular: Negative.   Gastrointestinal: Negative.   Musculoskeletal: Positive for back pain.  Psychiatric/Behavioral: Negative.   All other systems reviewed and are negative.       Objective:   Physical Exam  Constitutional: She is oriented to person, place, and time. She appears well-developed and well-nourished.  Cardiovascular: Normal rate, regular rhythm and normal heart sounds.   Pulmonary/Chest: Effort normal and breath sounds normal.  Musculoskeletal:  FROM of lumbar spine with pain on flexion and extension (-) SLR bil Motor strength and sensation distally intact. FROM of right shoulder without pain Grips (=) bil  Neurological: She is alert and oriented to person, place, and time.  Skin: Skin is warm and dry.  Psychiatric: She has a normal mood and affect. Her behavior is normal. Judgment and thought content normal.   BP 130/69  Pulse 86  Temp(Src) 96.4 F (35.8 C) (Oral)  Ht 5\' 5"  (1.651 m)  Wt 242 lb (109.77 kg)  BMI 40.27 kg/m2  Lumbar spine- mild lumbar curvature-Preliminary reading by Ronnald Collum, FNP  [facility]       Assessment & Plan:   1. Low back pain   2. Right arm pain    Meds ordered this encounter  Medications  . lamoTRIgine (LAMICTAL) 100 MG tablet    Sig: Take 100 mg by mouth daily.  . cyclobenzaprine (FLEXERIL) 10 MG tablet    Sig: Take 1 tablet (10 mg total) by mouth 3 (three) times daily as needed for muscle spasms.    Dispense:  30 tablet    Refill:  2    Order Specific  Question:  Supervising Provider    Answer:  Chipper Herb [[id number]]  . meloxicam (MOBIC) 15 MG tablet    Sig: Take 1 tablet (15 mg total) by mouth daily.    Dispense:  30 tablet    Refill:  0    Order Specific Question:  Supervising Provider    Answer:  Chipper Herb [[id number]]  sedation precautions with flexeril Moist heat Rest RTO prn  Mary-Margaret Hassell Done, FNP

## 2013-11-17 NOTE — Patient Instructions (Signed)
Back Pain, Adult Low back pain is very common. About 1 in 5 people have back pain.The cause of low back pain is rarely dangerous. The pain often gets better over time.About half of people with a sudden onset of back pain feel better in just 2 weeks. About 8 in 10 people feel better by 6 weeks.  CAUSES Some common causes of back pain include:  Strain of the muscles or ligaments supporting the spine.  Wear and tear (degeneration) of the spinal discs.  Arthritis.  Direct injury to the back. DIAGNOSIS Most of the time, the direct cause of low back pain is not known.However, back pain can be treated effectively even when the exact cause of the pain is unknown.Answering your caregiver's questions about your overall health and symptoms is one of the most accurate ways to make sure the cause of your pain is not dangerous. If your caregiver needs more information, he or she may order lab work or imaging tests (X-rays or MRIs).However, even if imaging tests show changes in your back, this usually does not require surgery. HOME CARE INSTRUCTIONS For many people, back pain returns.Since low back pain is rarely dangerous, it is often a condition that people can learn to manageon their own.   Remain active. It is stressful on the back to sit or stand in one place. Do not sit, drive, or stand in one place for more than 30 minutes at a time. Take short walks on level surfaces as soon as pain allows.Try to increase the length of time you walk each day.  Do not stay in bed.Resting more than 1 or 2 days can delay your recovery.  Do not avoid exercise or work.Your body is made to move.It is not dangerous to be active, even though your back may hurt.Your back will likely heal faster if you return to being active before your pain is gone.  Pay attention to your body when you bend and lift. Many people have less discomfortwhen lifting if they bend their knees, keep the load close to their bodies,and  avoid twisting. Often, the most comfortable positions are those that put less stress on your recovering back.  Find a comfortable position to sleep. Use a firm mattress and lie on your side with your knees slightly bent. If you lie on your back, put a pillow under your knees.  Only take over-the-counter or prescription medicines as directed by your caregiver. Over-the-counter medicines to reduce pain and inflammation are often the most helpful.Your caregiver may prescribe muscle relaxant drugs.These medicines help dull your pain so you can more quickly return to your normal activities and healthy exercise.  Put ice on the injured area.  Put ice in a plastic bag.  Place a towel between your skin and the bag.  Leave the ice on for 15-20 minutes, 03-04 times a day for the first 2 to 3 days. After that, ice and heat may be alternated to reduce pain and spasms.  Ask your caregiver about trying back exercises and gentle massage. This may be of some benefit.  Avoid feeling anxious or stressed.Stress increases muscle tension and can worsen back pain.It is important to recognize when you are anxious or stressed and learn ways to manage it.Exercise is a great option. SEEK MEDICAL CARE IF:  You have pain that is not relieved with rest or medicine.  You have pain that does not improve in 1 week.  You have new symptoms.  You are generally not feeling well. SEEK   IMMEDIATE MEDICAL CARE IF:   You have pain that radiates from your back into your legs.  You develop new bowel or bladder control problems.  You have unusual weakness or numbness in your arms or legs.  You develop nausea or vomiting.  You develop abdominal pain.  You feel faint. Document Released: 08/14/2005 Document Revised: 02/13/2012 Document Reviewed: 01/02/2011 Exit[employer] Patient Information 2014 [employer].  

## 2013-11-18 ENCOUNTER — Ambulatory Visit: Payer: Self-pay | Admitting: Obstetrics & Gynecology

## 2013-11-18 DIAGNOSIS — Z01419 Encounter for gynecological examination (general) (routine) without abnormal findings: Secondary | ICD-10-CM

## 2013-11-21 ENCOUNTER — Telehealth: Payer: Self-pay | Admitting: Nurse Practitioner

## 2013-11-21 DIAGNOSIS — M2550 Pain in unspecified joint: Secondary | ICD-10-CM

## 2013-11-21 NOTE — Telephone Encounter (Signed)
Patient aware that referral is in progress

## 2013-11-21 NOTE — Telephone Encounter (Signed)
She wants referral for joint pain

## 2013-11-21 NOTE — Telephone Encounter (Signed)
Referral made 

## 2013-11-21 NOTE — Telephone Encounter (Signed)
Need to know for what in order to make referral

## 2013-11-26 ENCOUNTER — Telehealth: Payer: Self-pay | Admitting: Nurse Practitioner

## 2013-12-15 ENCOUNTER — Other Ambulatory Visit: Payer: Self-pay | Admitting: Nurse Practitioner

## 2013-12-17 NOTE — Telephone Encounter (Signed)
Last ov 11/17/13

## 2013-12-24 ENCOUNTER — Telehealth: Payer: Self-pay | Admitting: Nurse Practitioner

## 2013-12-24 MED ORDER — ALPRAZOLAM 0.5 MG PO TABS: ORAL_TABLET | ORAL | Status: DC

## 2013-12-24 MED ORDER — LAMOTRIGINE 100 MG PO TABS: 100.0000 mg | ORAL_TABLET | Freq: Every day | ORAL | Status: DC

## 2013-12-24 MED ORDER — VENLAFAXINE HCL ER 150 MG PO CP24: 150.0000 mg | ORAL_CAPSULE | Freq: Every day | ORAL | Status: DC

## 2013-12-24 NOTE — Telephone Encounter (Signed)
Unable to get in with Psych because of conflicting schedules. Psych will not fill meds without appt. Wants to know if you will refill Effexor, Lamictal, and Xanax. She understands that this can't be a long term solution and that she will have to work seeing her psych into her schedule.  She may switch psychs since their schedule doesn't match up.

## 2013-12-24 NOTE — Telephone Encounter (Signed)
Called in.

## 2013-12-24 NOTE — Telephone Encounter (Signed)
Please call in xanax 0.5 po qd prn #30  with 0 refills

## 2014-01-20 ENCOUNTER — Telehealth: Payer: Self-pay | Admitting: Nurse Practitioner

## 2014-01-20 NOTE — Telephone Encounter (Signed)
Please look up in registry

## 2014-01-21 ENCOUNTER — Other Ambulatory Visit: Payer: Self-pay | Admitting: Nurse Practitioner

## 2014-01-22 NOTE — Telephone Encounter (Signed)
Order to phone in rx done earlier today

## 2014-01-22 NOTE — Telephone Encounter (Signed)
Cyril Mourning is looking up.Marland Kitchen

## 2014-01-22 NOTE — Telephone Encounter (Signed)
Called in [employer]

## 2014-01-22 NOTE — Telephone Encounter (Signed)
Last filled 12/24/13, last seen 11/17/13, call into [facility]

## 2014-01-22 NOTE — Telephone Encounter (Signed)
Please call in xanax with 1 refills 

## 2014-02-09 ENCOUNTER — Telehealth: Payer: Self-pay | Admitting: Nurse Practitioner

## 2014-02-09 ENCOUNTER — Ambulatory Visit: Payer: Managed Care, Other (non HMO) | Admitting: Family Medicine

## 2014-02-09 NOTE — Telephone Encounter (Signed)
Pt having congestion and cough appt scheduled

## 2014-02-12 ENCOUNTER — Other Ambulatory Visit: Payer: Self-pay | Admitting: Nurse Practitioner

## 2014-02-27 ENCOUNTER — Other Ambulatory Visit: Payer: Self-pay | Admitting: Nurse Practitioner

## 2014-03-02 NOTE — Telephone Encounter (Signed)
Last seen 11/17/13  MMM

## 2014-03-16 ENCOUNTER — Other Ambulatory Visit: Payer: Self-pay | Admitting: Nurse Practitioner

## 2014-03-17 NOTE — Telephone Encounter (Signed)
Please call in xanax 0.5  1 po prn with 0 refills

## 2014-03-17 NOTE — Telephone Encounter (Signed)
Patient last seen in office on 11-17-13. Rx for xanax last filled on 02-18-14 for #30. Please advise. If approved please route to Pool B so nurse can phone in to pharmacy

## 2014-03-20 ENCOUNTER — Other Ambulatory Visit: Payer: Self-pay | Admitting: Nurse Practitioner

## 2014-03-23 NOTE — Telephone Encounter (Signed)
Last seen 11/17/13, last filled 02/18/14. Call into [facility]

## 2014-03-23 NOTE — Telephone Encounter (Signed)
Please call in xanax with 1 refills 

## 2014-03-23 NOTE — Telephone Encounter (Signed)
Called in.

## 2014-04-01 ENCOUNTER — Ambulatory Visit: Payer: Managed Care, Other (non HMO) | Admitting: Family Medicine

## 2014-04-07 ENCOUNTER — Encounter: Payer: Self-pay | Admitting: Family Medicine

## 2014-04-07 ENCOUNTER — Telehealth: Payer: Self-pay | Admitting: Family Medicine

## 2014-04-07 ENCOUNTER — Ambulatory Visit (INDEPENDENT_AMBULATORY_CARE_PROVIDER_SITE_OTHER): Payer: Managed Care, Other (non HMO) | Admitting: Family Medicine

## 2014-04-07 VITALS — BP 130/58 | HR 65 | Temp 97.2°F | Ht 65.0 in | Wt 229.0 lb

## 2014-04-07 DIAGNOSIS — M25512 Pain in left shoulder: Secondary | ICD-10-CM

## 2014-04-07 DIAGNOSIS — G609 Hereditary and idiopathic neuropathy, unspecified: Secondary | ICD-10-CM

## 2014-04-07 DIAGNOSIS — M25519 Pain in unspecified shoulder: Secondary | ICD-10-CM

## 2014-04-07 DIAGNOSIS — M129 Arthropathy, unspecified: Secondary | ICD-10-CM

## 2014-04-07 DIAGNOSIS — R5383 Other fatigue: Secondary | ICD-10-CM

## 2014-04-07 DIAGNOSIS — M1990 Unspecified osteoarthritis, unspecified site: Secondary | ICD-10-CM

## 2014-04-07 DIAGNOSIS — R5381 Other malaise: Secondary | ICD-10-CM

## 2014-04-07 LAB — POCT CBC
Granulocyte percent: 69.2 %G (ref 37–80)
HCT, POC: 38.7 % (ref 37.7–47.9)
Hemoglobin: 12.5 g/dL (ref 12.2–16.2)
Lymph, poc: 2 (ref 0.6–3.4)
MCH, POC: 26.1 pg — AB (ref 27–31.2)
MCHC: 32.4 g/dL (ref 31.8–35.4)
MCV: 80.6 fL (ref 80–97)
MPV: 7.6 fL (ref 0–99.8)
POC Granulocyte: 5.5 (ref 2–6.9)
POC LYMPH PERCENT: 25.3 %L (ref 10–50)
Platelet Count, POC: 290 10*3/uL (ref 142–424)
RBC: 4.8 M/uL (ref 4.04–5.48)
RDW, POC: 15.8 %
WBC: 7.9 10*3/uL (ref 4.6–10.2)

## 2014-04-07 MED ORDER — MELOXICAM 15 MG PO TABS: 15.0000 mg | ORAL_TABLET | Freq: Every day | ORAL | Status: DC

## 2014-04-07 MED ORDER — GABAPENTIN 300 MG PO CAPS: 300.0000 mg | ORAL_CAPSULE | Freq: Three times a day (TID) | ORAL | Status: DC

## 2014-04-07 NOTE — Telephone Encounter (Signed)
appt scheduled

## 2014-04-07 NOTE — Progress Notes (Signed)
   Subjective:    Patient ID: OLUWATOSIN BRACY, female    DOB: 11-17-1980, 33 y.o.   MRN: [id number]  HPI  This 33 y.o. female presents for evaluation of left shoulder pain and discomfort.  She c/o right knee arthralgias.  She has had peripheral neuropathy throughout her body after radiation therapy due to ovarian cancer.  She has been seeing RA and she states she has not been dx with specific RA dx just that she has neuropathy.  Review of Systems C/o numbness and left shoulder pain and right knee pain   No chest pain, SOB, HA, dizziness, vision change, N/V, diarrhea, constipation, dysuria, urinary urgency or frequency, myalgias, arthralgias or rash.  Objective:   Physical Exam  Vital signs noted  Well developed well nourished female.  HEENT - Head atraumatic Normocephalic                Eyes - PERRLA, Conjuctiva - clear Sclera- Clear EOMI                Ears - EAC's Wnl TM's Wnl Gross Hearing WNL                Nose - Nares patent                 Throat - oropharanx wnl Respiratory - Lungs CTA bilateral Cardiac - RRR S1 and S2 without murmur GI - Abdomen soft Nontender and bowel sounds active x 4 Extremities - No edema. Neuro - Grossly intact. MS - decrease rom with abduction and internal and external rotation left shoulder.     Assessment & Plan:  Unspecified hereditary and idiopathic peripheral neuropathy - Plan: gabapentin (NEURONTIN) 300 MG capsule  Left shoulder pain - Plan: meloxicam (MOBIC) 15 MG tablet  Other malaise and fatigue - Plan: POCT CBC, Vitamin B12, Vit D  25 hydroxy (rtn osteoporosis monitoring), CMP14+EGFR, Thyroid Panel With TSH, Lipid panel  Arthritis - Plan: meloxicam (MOBIC) 15 MG tablet  Lysbeth Penner FNP

## 2014-04-08 LAB — CMP14+EGFR
ALT: 16 IU/L (ref 0–32)
AST: 14 IU/L (ref 0–40)
Albumin/Globulin Ratio: 1.5 (ref 1.1–2.5)
Albumin: 4 g/dL (ref 3.5–5.5)
Alkaline Phosphatase: 151 IU/L — ABNORMAL HIGH (ref 39–117)
BUN/Creatinine Ratio: 8 (ref 8–20)
BUN: 7 mg/dL (ref 6–20)
CO2: 23 mmol/L (ref 18–29)
Calcium: 9 mg/dL (ref 8.7–10.2)
Chloride: 98 mmol/L (ref 97–108)
Creatinine, Ser: 0.85 mg/dL (ref 0.57–1.00)
GFR calc Af Amer: 103 mL/min/1.73
GFR calc non Af Amer: 90 mL/min/1.73
Globulin, Total: 2.6 g/dL (ref 1.5–4.5)
Glucose: 96 mg/dL (ref 65–99)
Potassium: 3.9 mmol/L (ref 3.5–5.2)
Sodium: 140 mmol/L (ref 134–144)
Total Bilirubin: <0.2 mg/dL (ref 0.0–1.2)
Total Protein: 6.6 g/dL (ref 6.0–8.5)

## 2014-04-08 LAB — LIPID PANEL
Chol/HDL Ratio: 4.2 ratio units (ref 0.0–4.4)
Cholesterol, Total: 167 mg/dL (ref 100–199)
HDL: 40 mg/dL (ref >39)
LDL Calculated: 93 mg/dL (ref 0–99)
Triglycerides: 169 mg/dL — ABNORMAL HIGH (ref 0–149)
VLDL Cholesterol Cal: 34 mg/dL (ref 5–40)

## 2014-04-08 LAB — THYROID PANEL WITH TSH
Free Thyroxine Index: 1.6 (ref 1.2–4.9)
T3 Uptake Ratio: 21 % — ABNORMAL LOW (ref 24–39)
T4, Total: 7.5 ug/dL (ref 4.5–12.0)
TSH: 0.803 u[IU]/mL (ref 0.450–4.500)

## 2014-04-08 LAB — VITAMIN B12: Vitamin B-12: 1041 pg/mL — ABNORMAL HIGH (ref 211–946)

## 2014-04-08 LAB — VITAMIN D 25 HYDROXY (VIT D DEFICIENCY, FRACTURES): Vit D, 25-Hydroxy: 21.9 ng/mL — ABNORMAL LOW (ref 30.0–100.0)

## 2014-04-10 ENCOUNTER — Telehealth: Payer: Self-pay | Admitting: Family Medicine

## 2014-04-10 NOTE — Telephone Encounter (Signed)
Message copied by Waverly Ferrari on Fri Apr 10, 2014  9:50 AM ------      Message from: Lysbeth Penner      Created: Thu Apr 09, 2014  4:17 PM       Vit D is low and can take vit D otc 2000 iu po qd.  Labs look good ------

## 2014-04-12 ENCOUNTER — Other Ambulatory Visit: Payer: Self-pay | Admitting: Nurse Practitioner

## 2014-04-16 ENCOUNTER — Encounter: Payer: Self-pay | Admitting: Family Medicine

## 2014-04-21 ENCOUNTER — Ambulatory Visit: Payer: Managed Care, Other (non HMO) | Admitting: Family Medicine

## 2014-05-18 ENCOUNTER — Other Ambulatory Visit: Payer: Self-pay | Admitting: Nurse Practitioner

## 2014-05-19 NOTE — Telephone Encounter (Signed)
Last ov 04/07/14. Last refill 04/21/14. Route to nurse pool. If approved call to [facility].

## 2014-05-22 ENCOUNTER — Ambulatory Visit: Payer: Managed Care, Other (non HMO) | Admitting: Family Medicine

## 2014-05-23 ENCOUNTER — Other Ambulatory Visit: Payer: Self-pay | Admitting: Nurse Practitioner

## 2014-05-25 NOTE — Telephone Encounter (Signed)
Patient last seen in office on 04-07-14. Rx last filled on 04-21-14 for #30. Please advise.  If approved please route to pool B so nurse can phone in to pharmacy

## 2014-05-26 NOTE — Telephone Encounter (Signed)
Please call in xanax with 1 refills 

## 2014-05-26 NOTE — Telephone Encounter (Signed)
Left refill authorization on voicemail

## 2014-06-08 ENCOUNTER — Encounter: Payer: Self-pay | Admitting: Family Medicine

## 2014-06-08 ENCOUNTER — Telehealth: Payer: Self-pay | Admitting: Family Medicine

## 2014-06-08 ENCOUNTER — Ambulatory Visit (INDEPENDENT_AMBULATORY_CARE_PROVIDER_SITE_OTHER): Payer: Managed Care, Other (non HMO) | Admitting: Family Medicine

## 2014-06-08 VITALS — BP 120/55 | HR 76 | Temp 97.8°F | Ht 65.0 in | Wt 238.0 lb

## 2014-06-08 DIAGNOSIS — G609 Hereditary and idiopathic neuropathy, unspecified: Secondary | ICD-10-CM

## 2014-06-08 DIAGNOSIS — J069 Acute upper respiratory infection, unspecified: Secondary | ICD-10-CM

## 2014-06-08 MED ORDER — METHYLPREDNISOLONE ACETATE 80 MG/ML IJ SUSP
80.0000 mg | Freq: Once | INTRAMUSCULAR | Status: AC
  Administered 2014-06-08: 80 mg via INTRAMUSCULAR

## 2014-06-08 MED ORDER — AZITHROMYCIN 250 MG PO TABS: ORAL_TABLET | ORAL | Status: DC

## 2014-06-08 MED ORDER — GABAPENTIN 600 MG PO TABS: 600.0000 mg | ORAL_TABLET | Freq: Three times a day (TID) | ORAL | Status: DC

## 2014-06-08 NOTE — Progress Notes (Signed)
   Subjective:    Patient ID: Mary Cox, female    DOB: Sep 15, 1980, 33 y.o.   MRN: [id number]  HPI This 33 y.o. female presents for evaluation of numbness of legs and URI sxs.  She has hx of ovarian cancer and had hysterectomy, and had chemotherapy.  She noticed the numbness in her lower abdomen and legs after surgery.   Review of Systems C/o uri sx's and numbness No chest pain, SOB, HA, dizziness, vision change, N/V, diarrhea, constipation, dysuria, urinary urgency or frequency, myalgias, arthralgias or rash.     Objective:   Physical Exam  Vital signs noted  Well developed well nourished female.  HEENT - Head atraumatic Normocephalic                Eyes - PERRLA, Conjuctiva - clear Sclera- Clear EOMI                Ears - EAC's Wnl TM's Wnl Gross Hearing WNL                Nose - Nares patent                 Throat - oropharanx wnl Respiratory - Lungs CTA bilateral Cardiac - RRR S1 and S2 without murmur GI - Abdomen soft Nontender and bowel sounds active x 4 Extremities - No edema. Neuro - Grossly intact.      Assessment & Plan:  Hereditary and idiopathic peripheral neuropathy - Plan: gabapentin (NEURONTIN) 600 MG tablet, Ambulatory referral to Neurology  URI (upper respiratory infection) - Plan: azithromycin (ZITHROMAX) 250 MG tablet, methylPREDNISolone acetate (DEPO-MEDROL) injection 80 mg  Lysbeth Penner FNP

## 2014-06-08 NOTE — Telephone Encounter (Signed)
Appt already made by front

## 2014-06-11 ENCOUNTER — Telehealth: Payer: Self-pay | Admitting: Family Medicine

## 2014-06-11 NOTE — Telephone Encounter (Addendum)
Given Zpak at recent visit for URI. Has one dose left. Noticed diffuse itching over the past couple days. No rash or hives. She has taken Benadryl. Asked her to d/c Zpak. Continue Benadryl for itching. Add mucinex and increase water intake to think mucous. Explained that this may be viral which can last up to 2 weeks. Symptoms will be worse around the 4th and 5th day and should start to improve. If they worsen or do not improve she should schedule a follow up appt.  Patient stated understanding and agreement to plan.

## 2014-07-13 ENCOUNTER — Other Ambulatory Visit: Payer: Self-pay | Admitting: Nurse Practitioner

## 2014-07-15 NOTE — Telephone Encounter (Signed)
Please review and advise.

## 2014-07-15 NOTE — Telephone Encounter (Signed)
Last seen 06/08/14 [place]  If approved route to nurse to call into [facility]

## 2014-07-17 ENCOUNTER — Telehealth: Payer: Self-pay | Admitting: Family Medicine

## 2014-07-17 ENCOUNTER — Other Ambulatory Visit: Payer: Self-pay | Admitting: Family Medicine

## 2014-07-17 NOTE — Telephone Encounter (Signed)
RX for Xanax called into [facility]

## 2014-07-17 NOTE — Telephone Encounter (Signed)
Patient aware NTBS 

## 2014-07-17 NOTE — Telephone Encounter (Signed)
NTBS.

## 2014-07-28 ENCOUNTER — Telehealth: Payer: Self-pay | Admitting: Family Medicine

## 2014-07-28 ENCOUNTER — Ambulatory Visit: Payer: Self-pay | Admitting: Neurology

## 2014-07-29 NOTE — Telephone Encounter (Signed)
Pt thinks she has UTI,pt given appt tomorrow with Dietrich Pates @ 2:45.

## 2014-07-30 ENCOUNTER — Encounter: Payer: Self-pay | Admitting: Family Medicine

## 2014-07-30 ENCOUNTER — Ambulatory Visit (INDEPENDENT_AMBULATORY_CARE_PROVIDER_SITE_OTHER): Payer: Managed Care, Other (non HMO) | Admitting: Family Medicine

## 2014-07-30 VITALS — BP 120/75 | HR 80 | Temp 96.7°F | Ht 65.0 in | Wt 231.8 lb

## 2014-07-30 DIAGNOSIS — R300 Dysuria: Secondary | ICD-10-CM

## 2014-07-30 LAB — POCT URINALYSIS DIPSTICK
Bilirubin, UA: NEGATIVE
Blood, UA: NEGATIVE
Glucose, UA: NEGATIVE
Ketones, UA: NEGATIVE
Leukocytes, UA: NEGATIVE
Nitrite, UA: NEGATIVE
Protein, UA: NEGATIVE
Spec Grav, UA: 1.02
Urobilinogen, UA: NEGATIVE
pH, UA: 6.5

## 2014-07-30 LAB — POCT UA - MICROSCOPIC ONLY
Casts, Ur, LPF, POC: NEGATIVE
Crystals, Ur, HPF, POC: NEGATIVE

## 2014-07-30 MED ORDER — TRAMADOL HCL 50 MG PO TABS: 50.0000 mg | ORAL_TABLET | Freq: Three times a day (TID) | ORAL | Status: DC | PRN

## 2014-07-30 MED ORDER — FLUCONAZOLE 150 MG PO TABS: 150.0000 mg | ORAL_TABLET | Freq: Once | ORAL | Status: DC

## 2014-07-30 MED ORDER — CIPROFLOXACIN HCL 500 MG PO TABS: 500.0000 mg | ORAL_TABLET | Freq: Two times a day (BID) | ORAL | Status: DC

## 2014-07-30 NOTE — Progress Notes (Signed)
   Subjective:    Patient ID: Mary Cox, female    DOB: [birth year], 33 y.o.   MRN: [id number]  HPI Patient is here c/o urinary sx's.  She has some white vaginal DC.  She is still having polyneuropathy and pain sx's despite the gabapentin. She request neurology referra.  Review of Systems  Constitutional: Negative for fever.  HENT: Negative for ear pain.   Eyes: Negative for discharge.  Respiratory: Negative for cough.   Cardiovascular: Negative for chest pain.  Gastrointestinal: Negative for abdominal distention.  Endocrine: Negative for polyuria.  Genitourinary: Negative for difficulty urinating.  Musculoskeletal: Negative for gait problem and neck pain.  Skin: Negative for color change and rash.  Neurological: Negative for speech difficulty and headaches.  Psychiatric/Behavioral: Negative for agitation.       Objective:    BP 120/75 mmHg  Pulse 80  Temp(Src) 96.7 F (35.9 C) (Oral)  Ht 5\' 5"  (1.651 m)  Wt 231 lb 12.8 oz (105.144 kg)  BMI 38.57 kg/m2 Physical Exam  Constitutional: She is oriented to person, place, and time. She appears well-developed and well-nourished.  HENT:  Head: Normocephalic and atraumatic.  Mouth/Throat: Oropharynx is clear and moist.  Eyes: Pupils are equal, round, and reactive to light.  Neck: Normal range of motion. Neck supple.  Cardiovascular: Normal rate and regular rhythm.   No murmur heard. Pulmonary/Chest: Effort normal and breath sounds normal.  Abdominal: Soft. Bowel sounds are normal. There is no tenderness.  Neurological: She is alert and oriented to person, place, and time.  Skin: Skin is warm and dry.  Psychiatric: She has a normal mood and affect.          Assessment & Plan:     ICD-9-CM ICD-10-CM   1. Dysuria 788.1 R30.0 POCT UA - Microscopic Only     POCT urinalysis dipstick     Ambulatory referral to Neurology     Urine culture   2. Neuropathy - referral to neurology and tramadol rx  Return if symptoms  worsen or fail to improve.  Lysbeth Penner FNP

## 2014-08-06 ENCOUNTER — Other Ambulatory Visit: Payer: Self-pay | Admitting: Nurse Practitioner

## 2014-08-06 ENCOUNTER — Telehealth: Payer: Self-pay | Admitting: Family Medicine

## 2014-08-06 NOTE — Telephone Encounter (Signed)
Patient wants to know if you think she should see orthopedic also. She was unable to go to work today due to the hip pain.

## 2014-08-07 ENCOUNTER — Encounter: Payer: Self-pay | Admitting: Neurology

## 2014-08-07 ENCOUNTER — Other Ambulatory Visit: Payer: Self-pay | Admitting: Family Medicine

## 2014-08-07 ENCOUNTER — Ambulatory Visit (INDEPENDENT_AMBULATORY_CARE_PROVIDER_SITE_OTHER): Payer: Managed Care, Other (non HMO) | Admitting: Neurology

## 2014-08-07 VITALS — BP 118/78 | HR 73 | Ht 66.0 in | Wt 232.0 lb

## 2014-08-07 DIAGNOSIS — M791 Myalgia, unspecified site: Secondary | ICD-10-CM

## 2014-08-07 DIAGNOSIS — M79651 Pain in right thigh: Secondary | ICD-10-CM

## 2014-08-07 DIAGNOSIS — M2550 Pain in unspecified joint: Secondary | ICD-10-CM

## 2014-08-07 DIAGNOSIS — Z79899 Other long term (current) drug therapy: Secondary | ICD-10-CM

## 2014-08-07 DIAGNOSIS — R202 Paresthesia of skin: Secondary | ICD-10-CM

## 2014-08-07 DIAGNOSIS — G5711 Meralgia paresthetica, right lower limb: Secondary | ICD-10-CM

## 2014-08-07 NOTE — Telephone Encounter (Signed)
Stp and she is aware to go to Neuro and not ortho. Will close call.

## 2014-08-07 NOTE — Progress Notes (Signed)
Mary Cox, Mary Cox the clinic by self.   History of Present Illness: Mary Cox is a 33 y.o. right-handed Caucasian female with ovarian cancer s/p resection and chemotherapy (2012), GERD, and depression presenting for evaluation of thigh numbness and right sided leg pain.  In 2012, she was diagnosed with ovarian cancer.  After overcoming treatment for this, she slowly noticed right thigh which is isolated Cox the lateral surface and spared the medial thigh and posterior leg. There no associated burning or tingling.  She had similar numbness on the left thigh, but Cox a much lesser extent.  She gained 50-60lb since her diagnosis of surgery.  She wears a belt, but does not think it is very tight.  She also complains of achy right leg pain involving the hips, knees, and legs.  She has occasional sharp pain of her right pain shoulder, jaw, hands, and feet.  She feels that her legs are weak.  No falls.  No bowel/bladder incontinence.  Nothing seems Cox make it better or worse, including activity, rest, or heat/ice.  She was recently started on gabapentin 600mg  TID and tramadol, without any benefit.  Additionally, she has a tender area of her right buttocks that hurts when she pushes on it.  Pain is localized and achy.  Out-side paper records, electronic medical record, and images have been reviewed where available and summarized as:  XR lumbar spine 11/17/2013: 1. No evidence for acute abnormality. 2. Mild rotoscoliosis. 3. Obstipation.  Past Medical History  Diagnosis Date  . Anxiety     [facility], dr watts  .  Epigastric pain   . Sleep apnea   . Chest pain   . Anxiety   . GERD (gastroesophageal reflux disease)   . Depression   . Cancer   . Ovarian cancer     Past Surgical History  Procedure Laterality Date  . Abdominal hysterectomy    . Appendectomy    . Hernia repair       Medications:  Current Outpatient Prescriptions on File Prior Cox Visit  Medication Sig Dispense Refill  . ALPRAZolam (XANAX) 0.5 MG tablet TAKE ONE TABLET BY MOUTH ONCE DAILY 30 tablet 0  . ciprofloxacin (CIPRO) 500 MG tablet Take 1 tablet (500 mg total) by mouth 2 (two) times daily. 20 tablet 0  . esomeprazole (NEXIUM) 40 MG capsule Take 40 mg by mouth daily before breakfast.    . estrogens, conjugated, (PREMARIN) 0.625 MG tablet Take 0.625 mg by mouth.    . fluconazole (DIFLUCAN) 150 MG tablet Take 1 tablet (150 mg total) by mouth once. 2 tablet 1  . gabapentin (NEURONTIN) 600 MG tablet Take 1 tablet (600 mg total) by mouth 3 (three) times daily. 90 tablet 11  . meloxicam (MOBIC) 15 MG tablet Take 1 tablet (15 mg total) by mouth daily. 30 tablet 3  . traMADol (ULTRAM) 50 MG tablet Take 1 tablet (50 mg total) by mouth every 8 (eight) hours as needed. 60 tablet 3  . venlafaxine XR (EFFEXOR-XR) 150 MG 24 hr capsule TAKE ONE CAPSULE BY MOUTH ONCE DAILY WITH BREAKFAST 30  capsule 0   No current facility-administered medications on file prior Cox visit.    Allergies:  Allergies  Allergen Reactions  . Celebrex [Celecoxib] Itching  . Codeine     REACTION: hives  . Morphine And Related   . Celebrex [Celecoxib] Rash    Family History: Family History  Problem Relation Age of Onset  . Coronary artery disease Neg Hx     Social History: History   Social History  . Marital Status: Married    Spouse Name: N/A    Number of Children: N/A  . Years of Education: N/A   Occupational History  . Not on file.   Social History Main Topics  . Smoking status: Never Smoker   . Smokeless tobacco: Not on file  . Alcohol  Use: No  . Drug Use: No  . Sexual Activity:    Partners: Male   Other Topics Concern  . Not on file   Social History Narrative   ** Merged History Encounter **   Lives with son in a one story home.     Works in a Engineer, maintenance.    Education: high school       Review of Systems:  CONSTITUTIONAL: No fevers, chills, night sweats, or weight loss.  +weight gain 60lb EYES: No visual changes or eye pain ENT: No hearing changes.  No history of nose bleeds.   RESPIRATORY: No cough, wheezing and shortness of breath.   CARDIOVASCULAR: Negative for chest pain, and palpitations.   GI: Negative for abdominal discomfort, blood in stools or black stools.  No recent change in bowel habits.   GU:  No history of incontinence.   MUSCLOSKELETAL: +history of joint pain or swelling.  No myalgias.   SKIN: Negative for lesions, rash, and itching.   HEMATOLOGY/ONCOLOGY: Negative for prolonged bleeding, bruising easily, and swollen nodes.  +history of cancer.   ENDOCRINE: Negative for cold or heat intolerance, polydipsia or goiter.   PSYCH:  No depression or anxiety symptoms.   NEURO: As Above.   Vital Signs:  BP 118/78 mmHg  Pulse 73  Ht 5\' 6"  (1.676 m)  Wt 232 lb (105.235 kg)  BMI 37.46 kg/m2  SpO2 96%  General Medical Exam:   General:  Well appearing, obese, comfortable.   Eyes/ENT: see cranial nerve examination.   Neck: No masses appreciated.  Full range of motion without tenderness.  No carotid bruits. Respiratory:  Clear Cox auscultation, good air entry bilaterally.   Cardiac:  Regular rate and rhythm, no murmur.   Extremities:  No deformities, edema, or skin discoloration.  Skin:  No rashes or lesions.  Neurological Exam: MENTAL STATUS including orientation Cox time, place, person, recent and remote memory, attention span and concentration, language, and fund of knowledge is normal.  Speech is not dysarthric.  CRANIAL NERVES: II:  No visual field defects.   Unremarkable fundi.   III-IV-VI: Pupils equal round and reactive Cox light.  Normal conjugate, extra-ocular eye movements in all directions of gaze.  No nystagmus.  No ptosis.   V:  Normal facial sensation.     VII:  Normal facial symmetry and movements.    VIII:  Normal hearing and vestibular function.   IX-X:  Normal palatal movement.   XI:  Normal shoulder shrug and head rotation.   XII:  Normal tongue strength and range of motion, no deviation or fasciculation.  MOTOR:  No atrophy, fasciculations or abnormal movements.  No pronator drift.  Tone  is normal.    Right Upper Extremity:    Left Upper Extremity:    Deltoid  5/5   Deltoid  5/5   Biceps  5/5   Biceps  5/5   Triceps  5/5   Triceps  5/5   Wrist extensors  5/5   Wrist extensors  5/5   Wrist flexors  5/5   Wrist flexors  5/5   Finger extensors  5/5   Finger extensors  5/5   Finger flexors  5/5   Finger flexors  5/5   Dorsal interossei  5/5   Dorsal interossei  5/5   Abductor pollicis  5/5   Abductor pollicis  5/5   Tone (Ashworth scale)  0  Tone (Ashworth scale)  0   Right Lower Extremity:    Left Lower Extremity:    Hip flexors  5/5   Hip flexors  5/5   Hip extensors  5/5   Hip extensors  5/5   Knee flexors  5/5   Knee flexors  5/5   Knee extensors  5/5   Knee extensors  5/5   Dorsiflexors  5/5   Dorsiflexors  5/5   Plantarflexors  5/5   Plantarflexors  5/5   Toe extensors  5/5   Toe extensors  5/5   Toe flexors  5/5   Toe flexors  5/5   Tone (Ashworth scale)  0  Tone (Ashworth scale)  0   MSRs:  Right                                                                 Left brachioradialis 2+  brachioradialis 2+  biceps 2+  biceps 2+  triceps 2+  triceps 2+  patellar 2+  patellar 2+  ankle jerk 2+  ankle jerk 2+  Hoffman no  Hoffman no  plantar response down  plantar response down   SENSORY:  Reduced pin prick and temperature over the lateral femoral cutaneous nerve distribution (R >L).  Otherwise, sensation Cox all  modalities is intact.  COORDINATION/GAIT: Normal finger-Cox- nose-finger and heel-Cox-shin.  Intact rapid alternating movements bilaterally.  Able Cox rise from a chair without using arms.  Gait narrow based and stable. Tandem and stressed gait intact. She can perform a squat without difficulty.   IMPRESSION/PLAN: Ms. Kaus is a 33 year-old female presenting for evaluation of bilateral thigh numbness and right sided achy pain.  Her examination is consistent with diminished sensation over the anterolateral thigh on the right > left side. 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 Management is conservative with avoidance of repetitive trauma Cox this region and weight loss, which I stressed.  She is undergoing evaluation for bariatric surgery.  Unfortunately, because numbness is her predominant symptoms, there is no role for neuralgesic medication.  Regarding her sharp/achy intermittent pain involving her right arm and leg, this seems atypical for neuropathy and even radiculopathy.  For completeness, I will perform EMG of the right arm and leg.    Since there has been no improvement with gabapentin, I would recommend that she taper it since weight gain can be an unwanted side effect of this medication.   Right  buttocks pain seems more muscukoskeletal, recommend ice/heat and NSAIDs.  Return Cox clinic after EMG   The duration of this appointment visit was 50 minutes of face-Cox-face time with the patient.  Greater than 50% of this time was spent in counseling, explanation of diagnosis, planning of further management, and coordination of care.   Thank Cox for allowing me Cox participate in patient's care.  If I can answer any additional questions, I would be pleased to do so.    Sincerely,    Donika K. Posey Pronto, DO

## 2014-08-07 NOTE — Patient Instructions (Addendum)
Meralgia paresthetica - Encouraged weight loss - Unfortunately, no medication to help with numbness  For your achy pain involving the right side, I will order EMG of the right arm and leg.  You may want to try reducing your gabapentin, since it is not helping and symptoms may not be related to nerve pain.  Try reducing by 300mg  every 3 days.  If symptoms worsen, go back to the dose you were taking.

## 2014-08-07 NOTE — Telephone Encounter (Signed)
No referral to ortho follow up with neurology

## 2014-08-12 ENCOUNTER — Other Ambulatory Visit: Payer: Self-pay | Admitting: Family Medicine

## 2014-08-12 NOTE — Telephone Encounter (Signed)
Last seen 07/30/14 B [place]  If approved route to nurse to call into [facility]

## 2014-08-12 NOTE — Telephone Encounter (Signed)
LMOM for pt that RX was called into [facility]

## 2014-08-12 NOTE — Telephone Encounter (Signed)
Please review and advise.

## 2014-08-25 ENCOUNTER — Ambulatory Visit (INDEPENDENT_AMBULATORY_CARE_PROVIDER_SITE_OTHER): Payer: Managed Care, Other (non HMO) | Admitting: Family Medicine

## 2014-08-25 ENCOUNTER — Encounter: Payer: Self-pay | Admitting: Family Medicine

## 2014-08-25 VITALS — BP 123/71 | HR 68 | Temp 97.4°F | Ht 66.0 in | Wt 233.0 lb

## 2014-08-25 DIAGNOSIS — J069 Acute upper respiratory infection, unspecified: Secondary | ICD-10-CM

## 2014-08-25 MED ORDER — METHYLPREDNISOLONE ACETATE 80 MG/ML IJ SUSP
80.0000 mg | Freq: Once | INTRAMUSCULAR | Status: AC
  Administered 2014-08-25: 80 mg via INTRAMUSCULAR

## 2014-08-25 MED ORDER — AZITHROMYCIN 250 MG PO TABS: ORAL_TABLET | ORAL | Status: DC

## 2014-08-25 NOTE — Progress Notes (Signed)
   Subjective:    Patient ID: Mary Cox, female    DOB: [birth year], 33 y.o.   MRN: [id number]  HPI Patient is here with c/o uri sx's and sinus pressure for a week.  Review of Systems  Constitutional: Negative for fever.  HENT: Negative for ear pain.   Eyes: Negative for discharge.  Respiratory: Negative for cough.   Cardiovascular: Negative for chest pain.  Gastrointestinal: Negative for abdominal distention.  Endocrine: Negative for polyuria.  Genitourinary: Negative for difficulty urinating.  Musculoskeletal: Negative for gait problem and neck pain.  Skin: Negative for color change and rash.  Neurological: Negative for speech difficulty and headaches.  Psychiatric/Behavioral: Negative for agitation.       Objective:    BP 123/71 mmHg  Pulse 68  Temp(Src) 97.4 F (36.3 C) (Oral)  Ht 5\' 6"  (1.676 m)  Wt 233 lb (105.688 kg)  BMI 37.63 kg/m2 Physical Exam  Constitutional: She is oriented to person, place, and time. She appears well-developed and well-nourished.  HENT:  Head: Normocephalic and atraumatic.  Mouth/Throat: Oropharynx is clear and moist.  Eyes: Pupils are equal, round, and reactive to light.  Neck: Normal range of motion. Neck supple.  Cardiovascular: Normal rate and regular rhythm.   No murmur heard. Pulmonary/Chest: Effort normal and breath sounds normal.  Abdominal: Soft. Bowel sounds are normal. There is no tenderness.  Neurological: She is alert and oriented to person, place, and time.  Skin: Skin is warm and dry.  Psychiatric: She has a normal mood and affect.          Assessment & Plan:     ICD-9-CM ICD-10-CM   1. URI (upper respiratory infection) 465.9 J06.9 azithromycin (ZITHROMAX) 250 MG tablet     methylPREDNISolone acetate (DEPO-MEDROL) injection 80 mg   Push po fluids, rest, tylenol and motrin otc prn as directed for fever, arthralgias, and myalgias.  Follow up prn if sx's continue or persist.  No Follow-up on file.  Lysbeth Penner FNP

## 2014-08-28 ENCOUNTER — Other Ambulatory Visit: Payer: Self-pay | Admitting: Family Medicine

## 2014-09-01 ENCOUNTER — Telehealth: Payer: Self-pay | Admitting: Family Medicine

## 2014-09-01 MED ORDER — AMOXICILLIN 500 MG PO CAPS: 500.0000 mg | ORAL_CAPSULE | Freq: Three times a day (TID) | ORAL | Status: DC

## 2014-09-01 NOTE — Telephone Encounter (Signed)
Pt aware - med sent in to pharmacy.

## 2014-09-01 NOTE — Telephone Encounter (Signed)
Please call in amoxicillin 500  3 times daily for 10 days, if she gets worse or she is not better when this is completed she definitely needs to come back in for recheck.

## 2014-09-01 NOTE — Telephone Encounter (Signed)
Another antibiotic or NTBS?  Please advise.

## 2014-09-04 ENCOUNTER — Other Ambulatory Visit: Payer: Self-pay | Admitting: Nurse Practitioner

## 2014-09-06 ENCOUNTER — Other Ambulatory Visit: Payer: Self-pay | Admitting: Nurse Practitioner

## 2014-09-08 ENCOUNTER — Other Ambulatory Visit: Payer: Self-pay | Admitting: Family Medicine

## 2014-09-09 NOTE — Telephone Encounter (Signed)
Last filled 12/16 please advise

## 2014-09-09 NOTE — Telephone Encounter (Signed)
Last seen 08/25/14 B [place]  If approved route to nurse to call into [facility]

## 2014-09-10 ENCOUNTER — Other Ambulatory Visit: Payer: Self-pay | Admitting: Family Medicine

## 2014-09-10 NOTE — Telephone Encounter (Signed)
rx called into pharmacy

## 2014-09-14 ENCOUNTER — Other Ambulatory Visit: Payer: Self-pay | Admitting: Family Medicine

## 2014-09-15 ENCOUNTER — Ambulatory Visit: Payer: Self-pay | Admitting: Neurology

## 2014-09-17 ENCOUNTER — Encounter: Payer: Managed Care, Other (non HMO) | Admitting: Neurology

## 2014-09-21 ENCOUNTER — Telehealth: Payer: Self-pay | Admitting: Neurology

## 2014-09-21 NOTE — Telephone Encounter (Signed)
Pt no showed 09/17/13 [facility] appt. No show letter + no show policy mailed to pt / Sherri S.

## 2014-09-25 DIAGNOSIS — IMO0002 Reserved for concepts with insufficient information to code with codable children: Secondary | ICD-10-CM | POA: Insufficient documentation

## 2014-09-29 ENCOUNTER — Other Ambulatory Visit: Payer: Self-pay | Admitting: Family Medicine

## 2014-09-29 ENCOUNTER — Telehealth: Payer: Self-pay | Admitting: Family Medicine

## 2014-09-29 NOTE — Telephone Encounter (Signed)
Can we refer her to [facility]

## 2014-09-29 NOTE — Telephone Encounter (Signed)
Ok to give patient number to behavioral health for self referral.

## 2014-09-30 NOTE — Telephone Encounter (Signed)
Left detailed voice mail that we will mail her out information so she can call and make her own appt. Advised patient to contact office if she had any further questions

## 2014-10-06 ENCOUNTER — Other Ambulatory Visit: Payer: Self-pay | Admitting: Family Medicine

## 2014-10-06 ENCOUNTER — Other Ambulatory Visit: Payer: Self-pay | Admitting: Nurse Practitioner

## 2014-10-07 ENCOUNTER — Telehealth: Payer: Self-pay | Admitting: Family Medicine

## 2014-10-07 ENCOUNTER — Other Ambulatory Visit: Payer: Self-pay | Admitting: Family Medicine

## 2014-10-07 MED ORDER — FLUCONAZOLE 150 MG PO TABS: 150.0000 mg | ORAL_TABLET | Freq: Once | ORAL | Status: DC

## 2014-10-07 NOTE — Telephone Encounter (Signed)
Please advise on refill.  Patient last seen 08/26/15, no follow up scheduled.

## 2014-10-07 NOTE — Telephone Encounter (Signed)
Please call in xanax with 1 refills 

## 2014-10-08 ENCOUNTER — Other Ambulatory Visit: Payer: Self-pay | Admitting: Family Medicine

## 2014-10-08 NOTE — Telephone Encounter (Signed)
Called into pharmacy

## 2014-10-14 DIAGNOSIS — K8010 Calculus of gallbladder with chronic cholecystitis without obstruction: Secondary | ICD-10-CM | POA: Insufficient documentation

## 2014-10-27 ENCOUNTER — Telehealth: Payer: Self-pay | Admitting: Family Medicine

## 2014-10-27 NOTE — Telephone Encounter (Signed)
Patient states she will call us back to schedule since no more available appointments today.

## 2014-10-30 ENCOUNTER — Ambulatory Visit (INDEPENDENT_AMBULATORY_CARE_PROVIDER_SITE_OTHER): Payer: Managed Care, Other (non HMO) | Admitting: Family Medicine

## 2014-10-30 ENCOUNTER — Encounter: Payer: Self-pay | Admitting: Family Medicine

## 2014-10-30 VITALS — BP 118/75 | HR 71 | Temp 97.0°F | Ht 66.0 in | Wt 232.0 lb

## 2014-10-30 DIAGNOSIS — M25519 Pain in unspecified shoulder: Secondary | ICD-10-CM | POA: Insufficient documentation

## 2014-10-30 DIAGNOSIS — M25511 Pain in right shoulder: Secondary | ICD-10-CM

## 2014-10-30 MED ORDER — CYCLOBENZAPRINE HCL 10 MG PO TABS: 10.0000 mg | ORAL_TABLET | Freq: Three times a day (TID) | ORAL | Status: DC | PRN

## 2014-10-30 MED ORDER — DICLOFENAC SODIUM 75 MG PO TBEC: 75.0000 mg | DELAYED_RELEASE_TABLET | Freq: Two times a day (BID) | ORAL | Status: DC

## 2014-10-30 NOTE — Progress Notes (Signed)
Subjective:  Patient ID: Mary Cox, female    DOB: May 31, 1981  Age: 34 y.o. MRN: [id number]  CC: Shoulder Pain   HPI Mary Cox presents for 2 day R shoulder pain.Worse with motion. Partial relief with  800 mg ibuprofen.  History Mary Cox has a past medical history of Anxiety; Epigastric pain; Sleep apnea; Chest pain; Anxiety; GERD (gastroesophageal reflux disease); Depression; Cancer; and Ovarian cancer.   She has past surgical history that includes Abdominal hysterectomy; Appendectomy; and Hernia repair.   Her family history is negative for Coronary artery disease.She reports that she has never smoked. She does not have any smokeless tobacco history on file. She reports that she does not drink alcohol or use illicit drugs.  Current Outpatient Prescriptions on File Prior to Visit  Medication Sig Dispense Refill  . ALPRAZolam (XANAX) 0.5 MG tablet TAKE ONE TABLET BY MOUTH ONCE DAILY 30 tablet 0  . estrogens, conjugated, (PREMARIN) 0.625 MG tablet Take 0.625 mg by mouth.    . gabapentin (NEURONTIN) 600 MG tablet Take 1 tablet (600 mg total) by mouth 3 (three) times daily. 90 tablet 11  . meloxicam (MOBIC) 15 MG tablet TAKE ONE TABLET BY MOUTH ONCE DAILY 30 tablet 2  . traMADol (ULTRAM) 50 MG tablet Take 1 tablet (50 mg total) by mouth every 8 (eight) hours as needed. 60 tablet 3   No current facility-administered medications on file prior to visit.    ROS Review of Systems  Objective:  BP 118/75 mmHg  Pulse 71  Temp(Src) 97 F (36.1 C) (Oral)  Ht 5\' 6"  (1.676 m)  Wt 232 lb (105.235 kg)  BMI 37.46 kg/m2  BP Readings from Last 3 Encounters:  10/30/14 118/75  08/25/14 123/71  08/07/14 118/78    Wt Readings from Last 3 Encounters:  10/30/14 232 lb (105.235 kg)  08/25/14 233 lb (105.688 kg)  08/07/14 232 lb (105.235 kg)     Physical Exam  Constitutional: She is oriented to person, place, and time. She appears well-developed and well-nourished.  HENT:  Head:  Normocephalic.  Neck: Normal range of motion.  Cardiovascular: Normal rate and regular rhythm.  Exam reveals no gallop and no friction rub.   No murmur heard. Pulmonary/Chest: Breath sounds normal.  Musculoskeletal:  There is diminished abduction and external rotation of the right upper extremity at the shoulder. The extremity is neurovascularly intact for light touch capillary refill and pulses. The elbow range of motion is fully intact with normal strength as is grip and wrist  Neurological: She is alert and oriented to person, place, and time.    No results found for: HGBA1C  Lab Results  Component Value Date   WBC 7.9 04/07/2014   HGB 12.5 04/07/2014   HCT 38.7 04/07/2014   PLT 286 05/30/2010   GLUCOSE 96 04/07/2014   CHOL 167 04/07/2014   TRIG 169* 04/07/2014   HDL 40 04/07/2014   LDLCALC 93 04/07/2014   ALT 16 04/07/2014   AST 14 04/07/2014   NA 140 04/07/2014   K 3.9 04/07/2014   CL 98 04/07/2014   CREATININE 0.85 04/07/2014   BUN 7 04/07/2014   CO2 23 04/07/2014   TSH 0.803 04/07/2014    No results found.  Assessment & Plan:   Rolando was seen today for shoulder pain.  Diagnoses and all orders for this visit:  Pain in joint, shoulder region, right Orders: -     Ambulatory referral to Physical Therapy  Other orders -  diclofenac (VOLTAREN) 75 MG EC tablet; Take 1 tablet (75 mg total) by mouth 2 (two) times daily. -     cyclobenzaprine (FLEXERIL) 10 MG tablet; Take 1 tablet (10 mg total) by mouth 3 (three) times daily as needed for muscle spasms.   I have discontinued Mary Cox's esomeprazole, azithromycin, amoxicillin, fluconazole, and dexlansoprazole. I am also having her start on diclofenac and cyclobenzaprine. Additionally, I am having her maintain her estrogens (conjugated), gabapentin, traMADol, meloxicam, ALPRAZolam, and Linaclotide.  Meds ordered this encounter  Medications  . DISCONTD: dexlansoprazole (DEXILANT) 60 MG capsule    Sig: Take 60 mg  by mouth.  . Linaclotide (LINZESS) 145 MCG CAPS capsule    Sig: Take 145 mcg by mouth.  . diclofenac (VOLTAREN) 75 MG EC tablet    Sig: Take 1 tablet (75 mg total) by mouth 2 (two) times daily.    Dispense:  60 tablet    Refill:  2  . cyclobenzaprine (FLEXERIL) 10 MG tablet    Sig: Take 1 tablet (10 mg total) by mouth 3 (three) times daily as needed for muscle spasms.    Dispense:  90 tablet    Refill:  1     Follow-up: Return in about 2 weeks (around 11/13/2014).  Claretta Fraise, M.D.

## 2014-11-02 ENCOUNTER — Other Ambulatory Visit: Payer: Self-pay | Admitting: Nurse Practitioner

## 2014-11-02 ENCOUNTER — Encounter: Payer: Managed Care, Other (non HMO) | Admitting: Neurology

## 2014-11-05 ENCOUNTER — Encounter: Payer: Self-pay | Admitting: Neurology

## 2014-11-11 ENCOUNTER — Telehealth: Payer: Self-pay | Admitting: Family Medicine

## 2014-11-11 NOTE — Telephone Encounter (Signed)
Please review and advise.

## 2014-11-11 NOTE — Telephone Encounter (Signed)
She would need to be seen

## 2014-11-12 NOTE — Telephone Encounter (Signed)
Patient aware she would ntbs. 

## 2014-11-17 ENCOUNTER — Encounter: Payer: Managed Care, Other (non HMO) | Admitting: Family Medicine

## 2014-11-17 NOTE — Progress Notes (Deleted)
   Subjective:  Patient ID: Mary Cox, female    DOB: 04-Mar-1981  Age: 34 y.o. MRN: [id number]  CC: No chief complaint on file.   HPI Mary Cox presents for ***  History Mary Cox has a past medical history of Anxiety; Epigastric pain; Sleep apnea; Chest pain; Anxiety; GERD (gastroesophageal reflux disease); Depression; Cancer; and Ovarian cancer.   She has past surgical history that includes Abdominal hysterectomy; Appendectomy; and Hernia repair.   Her family history is negative for Coronary artery disease.She reports that she has never smoked. She does not have any smokeless tobacco history on file. She reports that she does not drink alcohol or use illicit drugs.  Current Outpatient Prescriptions on File Prior to Visit  Medication Sig Dispense Refill  . ALPRAZolam (XANAX) 0.5 MG tablet TAKE ONE TABLET BY MOUTH ONCE DAILY 30 tablet 0  . cyclobenzaprine (FLEXERIL) 10 MG tablet Take 1 tablet (10 mg total) by mouth 3 (three) times daily as needed for muscle spasms. 90 tablet 1  . diclofenac (VOLTAREN) 75 MG EC tablet Take 1 tablet (75 mg total) by mouth 2 (two) times daily. 60 tablet 2  . estrogens, conjugated, (PREMARIN) 0.625 MG tablet Take 0.625 mg by mouth.    . gabapentin (NEURONTIN) 600 MG tablet Take 1 tablet (600 mg total) by mouth 3 (three) times daily. 90 tablet 11  . Linaclotide (LINZESS) 145 MCG CAPS capsule Take 145 mcg by mouth.    . meloxicam (MOBIC) 15 MG tablet TAKE ONE TABLET BY MOUTH ONCE DAILY 30 tablet 2  . traMADol (ULTRAM) 50 MG tablet Take 1 tablet (50 mg total) by mouth every 8 (eight) hours as needed. 60 tablet 3  . venlafaxine XR (EFFEXOR-XR) 150 MG 24 hr capsule TAKE ONE CAPSULE BY MOUTH ONCE DAILY WITH  BREAKFAST 30 capsule 2   No current facility-administered medications on file prior to visit.    ROS Review of Systems  Objective:  There were no vitals taken for this visit.  BP Readings from Last 3 Encounters:  10/30/14 118/75  08/25/14 123/71   08/07/14 118/78    Wt Readings from Last 3 Encounters:  10/30/14 232 lb (105.235 kg)  08/25/14 233 lb (105.688 kg)  08/07/14 232 lb (105.235 kg)     Physical Exam  No results found for: HGBA1C  Lab Results  Component Value Date   WBC 7.9 04/07/2014   HGB 12.5 04/07/2014   HCT 38.7 04/07/2014   PLT 286 05/30/2010   GLUCOSE 96 04/07/2014   CHOL 167 04/07/2014   TRIG 169* 04/07/2014   HDL 40 04/07/2014   LDLCALC 93 04/07/2014   ALT 16 04/07/2014   AST 14 04/07/2014   NA 140 04/07/2014   K 3.9 04/07/2014   CL 98 04/07/2014   CREATININE 0.85 04/07/2014   BUN 7 04/07/2014   CO2 23 04/07/2014   TSH 0.803 04/07/2014    No results found.  Assessment & Plan:   There are no diagnoses linked to this encounter. I am having Mary Cox maintain her estrogens (conjugated), gabapentin, traMADol, meloxicam, ALPRAZolam, Linaclotide, diclofenac, cyclobenzaprine, and venlafaxine XR.  No orders of the defined types were placed in this encounter.     Follow-up: No Follow-up on file.  Claretta Fraise, M.D.

## 2014-11-18 ENCOUNTER — Ambulatory Visit: Payer: Managed Care, Other (non HMO) | Admitting: Physical Therapy

## 2014-11-23 NOTE — Progress Notes (Signed)
Erroneous

## 2014-12-01 ENCOUNTER — Other Ambulatory Visit: Payer: Self-pay | Admitting: Nurse Practitioner

## 2014-12-01 ENCOUNTER — Telehealth: Payer: Self-pay | Admitting: Nurse Practitioner

## 2014-12-01 NOTE — Telephone Encounter (Signed)
Will have to talk to gyn about increasing premarin- not sure should be on that with history of breast cancer. Will NTBS to discuss sleep

## 2014-12-02 ENCOUNTER — Ambulatory Visit: Payer: Managed Care, Other (non HMO) | Admitting: Nurse Practitioner

## 2014-12-02 NOTE — Telephone Encounter (Signed)
Advised patient that she should contact gynecologist and we could take care of sleep issues. Appt made for today

## 2014-12-03 NOTE — Telephone Encounter (Signed)
Last seen 10/30/14 Dr Livia Snellen   If approved route to nurse to call into [facility]

## 2014-12-04 NOTE — Telephone Encounter (Signed)
Please review and advise.

## 2014-12-06 ENCOUNTER — Other Ambulatory Visit: Payer: Self-pay | Admitting: Nurse Practitioner

## 2014-12-07 NOTE — Telephone Encounter (Signed)
Please advise on refill- last seen by Dr. Livia Snellen 10/30/14, no follow up scheduled.

## 2014-12-08 NOTE — Telephone Encounter (Signed)
rx called into pharmacy

## 2014-12-08 NOTE — Telephone Encounter (Signed)
Please call in xanax with 1 refills 

## 2014-12-11 ENCOUNTER — Telehealth: Payer: Self-pay | Admitting: Family Medicine

## 2014-12-11 DIAGNOSIS — F411 Generalized anxiety disorder: Secondary | ICD-10-CM

## 2014-12-11 NOTE — Telephone Encounter (Signed)
She is wanting to see one for medication management and see therapist for anxiety/depression

## 2014-12-11 NOTE — Telephone Encounter (Signed)
Need to know what for.

## 2014-12-11 NOTE — Telephone Encounter (Signed)
Detailed message left that we need to know what she needs referral for.

## 2014-12-11 NOTE — Telephone Encounter (Signed)
Referral made 

## 2014-12-14 NOTE — Telephone Encounter (Signed)
Detailed message that referral has been made.

## 2014-12-15 ENCOUNTER — Telehealth: Payer: Self-pay | Admitting: Family Medicine

## 2014-12-15 DIAGNOSIS — M25511 Pain in right shoulder: Secondary | ICD-10-CM

## 2014-12-15 NOTE — Telephone Encounter (Signed)
Okay to write as requested. Include "no use of right arm."

## 2014-12-15 NOTE — Telephone Encounter (Signed)
Patient is requesting a note to be out of work for wed, thurs, Friday for her shoulder.

## 2014-12-16 ENCOUNTER — Encounter: Payer: Self-pay | Admitting: *Deleted

## 2014-12-16 MED ORDER — TRAMADOL HCL 50 MG PO TABS: 50.0000 mg | ORAL_TABLET | Freq: Three times a day (TID) | ORAL | Status: DC | PRN

## 2014-12-16 NOTE — Telephone Encounter (Signed)
lmtcb

## 2014-12-16 NOTE — Telephone Encounter (Signed)
Pt aware of increasing Tramadol, Refill has been printed to be signed as well as work note.

## 2014-12-16 NOTE — Telephone Encounter (Signed)
Referral to Ortho per Dr. Livia Snellen. In [place] per pt

## 2014-12-16 NOTE — Telephone Encounter (Signed)
I can't do that, but she can increase her tramadol to 2 at a time up to 4 times a day. If she needs a refill on that please send it in.

## 2014-12-22 ENCOUNTER — Telehealth: Payer: Self-pay | Admitting: Family Medicine

## 2014-12-24 LAB — HM PAP SMEAR

## 2015-01-01 ENCOUNTER — Other Ambulatory Visit: Payer: Self-pay | Admitting: Family Medicine

## 2015-01-01 NOTE — Telephone Encounter (Signed)
Called into [place] per Dr Livia Snellen

## 2015-01-04 ENCOUNTER — Ambulatory Visit (INDEPENDENT_AMBULATORY_CARE_PROVIDER_SITE_OTHER): Payer: Managed Care, Other (non HMO) | Admitting: Physician Assistant

## 2015-01-04 ENCOUNTER — Encounter: Payer: Self-pay | Admitting: Physician Assistant

## 2015-01-04 VITALS — BP 124/88 | HR 84 | Temp 97.2°F | Ht 66.0 in | Wt 230.0 lb

## 2015-01-04 DIAGNOSIS — R002 Palpitations: Secondary | ICD-10-CM | POA: Diagnosis not present

## 2015-01-04 DIAGNOSIS — J209 Acute bronchitis, unspecified: Secondary | ICD-10-CM

## 2015-01-04 DIAGNOSIS — G4700 Insomnia, unspecified: Secondary | ICD-10-CM | POA: Diagnosis not present

## 2015-01-04 MED ORDER — ALBUTEROL SULFATE HFA 108 (90 BASE) MCG/ACT IN AERS: 2.0000 | INHALATION_SPRAY | Freq: Four times a day (QID) | RESPIRATORY_TRACT | Status: DC | PRN

## 2015-01-04 MED ORDER — TRAZODONE HCL 50 MG PO TABS: ORAL_TABLET | ORAL | Status: DC

## 2015-01-04 MED ORDER — AZITHROMYCIN 250 MG PO TABS: ORAL_TABLET | ORAL | Status: DC

## 2015-01-04 NOTE — Patient Instructions (Signed)
Use otc plain musinex Cool mist humidifier RTO if s/s worsen or do not improve      Acute Bronchitis Bronchitis is when the airways that extend from the windpipe into the lungs get red, puffy, and painful (inflamed). Bronchitis often causes thick spit (mucus) to develop. This leads to a cough. A cough is the most common symptom of bronchitis. In acute bronchitis, the condition usually begins suddenly and goes away over time (usually in 2 weeks). Smoking, allergies, and asthma can make bronchitis worse. Repeated episodes of bronchitis may cause more lung problems. HOME CARE  Rest.  Drink enough fluids to keep your pee (urine) clear or pale yellow (unless you need to limit fluids as told by your doctor).  Only take over-the-counter or prescription medicines as told by your doctor.  Avoid smoking and secondhand smoke. These can make bronchitis worse. If you are a smoker, think about using nicotine gum or skin patches. Quitting smoking will help your lungs heal faster.  Reduce the chance of getting bronchitis again by:  Washing your hands often.  Avoiding people with cold symptoms.  Trying not to touch your hands to your mouth, nose, or eyes.  Follow up with your doctor as told. GET HELP IF: Your symptoms do not improve after 1 week of treatment. Symptoms include:  Cough.  Fever.  Coughing up thick spit.  Body aches.  Chest congestion.  Chills.  Shortness of breath.  Sore throat. GET HELP RIGHT AWAY IF:   You have an increased fever.  You have chills.  You have severe shortness of breath.  You have bloody thick spit (sputum).  You throw up (vomit) often.  You lose too much body fluid (dehydration).  You have a severe headache.  You faint. MAKE SURE YOU:   Understand these instructions.  Will watch your condition.  Will get help right away if you are not doing well or get worse. Document Released: 01/31/2008 Document Revised: 04/16/2013 Document  Reviewed: 02/04/2013 [facility] Patient Information 2015 [place], [place]. This information is not intended to replace advice given to you by your health care provider. Make sure you discuss any questions you have with your health care provider.

## 2015-01-04 NOTE — Progress Notes (Signed)
   Subjective:    Patient ID: Mary Cox, female    DOB: 1981-01-17, 34 y.o.   MRN: [id number]  HPI 34 y/o female presents with c/o chest congestion and cough over the weekend. Has also been having palpitation over the weekend. She had a similar episode a few years ago, which was evaluated and determined to be benign.   Regarding congestion, has tried OTC Tylenol cold and flu with no relief.     Review of Systems  Constitutional: Positive for diaphoresis.  HENT: Positive for congestion (chest ) and sinus pressure. Negative for postnasal drip, rhinorrhea, sneezing and sore throat.   Eyes: Negative.   Respiratory: Positive for cough (productive, worse at night ) and chest tightness. Negative for shortness of breath and wheezing.   Cardiovascular: Positive for palpitations. Negative for chest pain.  Gastrointestinal: Negative.   Genitourinary: Negative.   All other systems reviewed and are negative.      Objective:   Physical Exam  Constitutional: She is oriented to person, place, and time. She appears well-developed and well-nourished.  Obese    HENT:  Head: Normocephalic.  Mouth/Throat: Oropharynx is clear and moist.  Cardiovascular: Normal rate, regular rhythm and normal heart sounds.  Exam reveals no gallop and no friction rub.   No murmur heard. EKG performed WNL  Pulmonary/Chest: Effort normal. She has wheezes (generalized, more prominent on RUL).  Musculoskeletal: Normal range of motion.  Neurological: She is alert and oriented to person, place, and time.  Skin: She is not diaphoretic.  Psychiatric: She has a normal mood and affect. Her behavior is normal. Judgment and thought content normal.  Nursing note and vitals reviewed.         Assessment & Plan:  1. Palpitations  - EKG 12-Lead wnl. Advised patient if she continues to have episodes that she should f/u with Cardiologist that she saw before for further eval and cardiac monitor.   2. Acute bronchitis,  unspecified organism  - azithromycin (ZITHROMAX) 250 MG tablet; 2 pills on day 1, 1 pill on day 2-5  Dispense: 6 tablet; Refill: 0 - albuterol (PROVENTIL HFA;VENTOLIN HFA) 108 (90 BASE) MCG/ACT inhaler; Inhale 2 puffs into the lungs every 6 (six) hours as needed for wheezing or shortness of breath.  Dispense: 1 Inhaler; Refill: 0  3. Insomnia  - traZODone (DESYREL) 50 MG tablet; Take 1/2 tablet qhs  Dispense: 15 tablet; Refill: 2     Damyn Weitzel A. Benjamin Stain PA-C

## 2015-01-05 NOTE — Telephone Encounter (Signed)
Looks like it was done, weird.

## 2015-01-06 NOTE — Telephone Encounter (Signed)
RX has already been called into [facility]

## 2015-01-07 ENCOUNTER — Telehealth: Payer: Self-pay | Admitting: Physician Assistant

## 2015-01-07 NOTE — Telephone Encounter (Signed)
Detailed message left for patient that note is up front to be picked up.

## 2015-01-07 NOTE — Telephone Encounter (Signed)
[employer] for work note. Mary Cox A. Benjamin Stain PA-C

## 2015-01-18 ENCOUNTER — Ambulatory Visit (HOSPITAL_COMMUNITY): Payer: Managed Care, Other (non HMO) | Admitting: Psychiatry

## 2015-01-19 ENCOUNTER — Other Ambulatory Visit: Payer: Self-pay | Admitting: Nurse Practitioner

## 2015-01-27 ENCOUNTER — Telehealth: Payer: Self-pay | Admitting: Family Medicine

## 2015-01-27 ENCOUNTER — Ambulatory Visit (INDEPENDENT_AMBULATORY_CARE_PROVIDER_SITE_OTHER): Payer: 59 | Admitting: Psychiatry

## 2015-01-27 ENCOUNTER — Encounter (HOSPITAL_COMMUNITY): Payer: Self-pay | Admitting: Psychiatry

## 2015-01-27 VITALS — BP 118/80 | HR 70 | Ht 66.0 in | Wt 225.0 lb

## 2015-01-27 DIAGNOSIS — F411 Generalized anxiety disorder: Secondary | ICD-10-CM | POA: Diagnosis not present

## 2015-01-27 DIAGNOSIS — G4700 Insomnia, unspecified: Secondary | ICD-10-CM

## 2015-01-27 DIAGNOSIS — F331 Major depressive disorder, recurrent, moderate: Secondary | ICD-10-CM | POA: Diagnosis not present

## 2015-01-27 MED ORDER — VENLAFAXINE HCL ER 75 MG PO CP24: 75.0000 mg | ORAL_CAPSULE | Freq: Every day | ORAL | Status: DC

## 2015-01-27 MED ORDER — VENLAFAXINE HCL ER 150 MG PO CP24: ORAL_CAPSULE | ORAL | Status: DC

## 2015-01-27 MED ORDER — ALPRAZOLAM 0.5 MG PO TABS: 0.5000 mg | ORAL_TABLET | Freq: Every day | ORAL | Status: DC

## 2015-01-27 NOTE — Telephone Encounter (Signed)
Dr. De Nurse recommends patient have a sleep study for insomnia, ?sleep apnea

## 2015-01-27 NOTE — Progress Notes (Signed)
Psychiatric Initial Adult Assessment   Patient Identification: Mary Cox MRN:  [id number] Date of Evaluation:  01/27/2015 Referral Source: Primary care [facility] Chief Complaint:   Visit Diagnosis:    ICD-9-CM ICD-10-CM   1. GAD (generalized anxiety disorder) 300.02 F41.1   2. Major depressive disorder, recurrent episode, moderate 296.32 F33.1   3. Insomnia 780.52 G47.00    Diagnosis:   Patient Active Problem List   Diagnosis Date Noted  . Pain in joint, shoulder region [M25.519] 10/30/2014  . Biliary calculus with cholecystitis [K80.10] 10/14/2014  . Adult BMI 30+ [E66.8] 09/25/2014  . Cold intolerance [R68.89] 12/05/2012  . Other malaise and fatigue [R53.81, R53.83] 12/05/2012  . GERD (gastroesophageal reflux disease) [K21.9] 12/05/2012  . Generalized anxiety disorder [F41.1] 12/05/2012  . Anxiety [F41.9]   . ANXIETY DISORDER [F41.1] 09/15/2010  . ESOPHAGEAL REFLUX [K21.9] 09/15/2010  . HYPERSOMNIA WITH SLEEP APNEA UNSPECIFIED [G47.10, G47.30] 09/15/2010  . PALPITATIONS [R00.2] 09/15/2010   History of Present Illness:  Patient is a 34 years old currently separated Caucasian female living by herself and is working full-time  Referred by her primary care physician because of depression and anxiety. Endorses depression getting worse for the last 1 year. She has had symptoms of decreased energy decreased concentration, anhedonia withdrawn disturbed appetite sleep and feeling of hopelessness at times.  She also endorsed excessive worries, disturbed sleep muscle tightening at night. Worries are excessive and unreasonable at times including some palpitations and panic attacks which has been recent. She has been evaluated by primary care and EKG and her workup was benign  Aggravating factors; split from her husband 1 year ago after 54 years of marriage. Job stress  Modifying factors; her son 33  years of age.  She does not endorse having any delusions or hallucinations. There  is no paranoia or hallucinations when she is getting depressed.  She does not endorse using drugs or alcohol.  She has in the past treated by Dr. Jacklynn Barnacle last year for depression and anxiety then primary care contineud her meds. She had difficulty in cutting down on the Effexor shows she has been maintained on Effexor. Effexor does help her depression but she is still endorses some anxiety and feeling of despair.  Also has difficulty sleeping and maintaining sleep. Has been tested for sleep apnea but that is being many years ago. She does endorse difficulty focusing concentrating and feeling of unrefreshed sleep.   Elements:  Location:  depression and anxiety. Quality:  moderate. Severity:  5/10. depressio. 10 being no depression. anxiety is 7/10. 10 being extreme anxiety. Timing:  job stress or at evening. Duration:  more then one year intermittently. Context:  split one year ago after 16 years of marriage. job stress. some lega issue one year ago. Associated Signs/Symptoms: Depression Symptoms:  depressed mood, anhedonia, psychomotor retardation, fatigue, anxiety, panic attacks, (Hypo) Manic Symptoms:  Impulsivity, Anxiety Symptoms:  Excessive Worry, Psychotic Symptoms:  denies PTSD Symptoms: NA  Past Medical History:  Past Medical History  Diagnosis Date  . Anxiety     [facility], dr watts  . Epigastric pain   . Sleep apnea   . Chest pain   . Anxiety   . GERD (gastroesophageal reflux disease)   . Depression   . Cancer   . Ovarian cancer     Past Surgical History  Procedure Laterality Date  . Abdominal hysterectomy    . Appendectomy    . Hernia repair     Family History:  Family History  Problem Relation Age of Onset  . Coronary artery disease Neg Hx   . Depression Maternal Aunt   . Anxiety disorder Maternal Aunt   . Anxiety disorder Maternal Grandmother    Social History:   History   Social History  . Marital Status: Married     Spouse Name: N/A  . Number of Children: N/A  . Years of Education: N/A   Social History Main Topics  . Smoking status: Never Smoker   . Smokeless tobacco: Not on file  . Alcohol Use: No  . Drug Use: No  . Sexual Activity:    Partners: Male   Other Topics Concern  . None   Social History Narrative   ** Merged History Encounter **   Lives with son in a one story home.     Works in a Engineer, maintenance.    Education: high school      Additional Social History: Seperated white female. Has 3 years old son. Works full time  Musculoskeletal: Strength & Muscle Tone: within normal limits Gait & Station: normal Patient leans: no lean  Psychiatric Specialty Exam: HPI  Review of Systems  Respiratory: Negative for cough.   Cardiovascular: Negative for chest pain.  Gastrointestinal: Negative for nausea.  Skin: Negative for rash.  Neurological: Negative for tremors and headaches.  Psychiatric/Behavioral: Positive for depression and hallucinations. The patient has insomnia.     Blood pressure 118/80, pulse 70, height 5\' 6"  (1.676 m), weight 225 lb (102.059 kg).Body mass index is 36.33 kg/(m^2).  General Appearance: Casual  Eye Contact:  Fair  Speech:  Normal Rate  Volume:  Normal  Mood:  Dysphoric  Affect:  Congruent  Thought Process:  Coherent  Orientation:  Full (Time, Place, and Person)  Thought Content:  Rumination  Suicidal Thoughts:  No  Homicidal Thoughts:  No  Memory:  Immediate;   Fair Recent;   Fair  Judgement:  Fair  Insight:  Shallow  Psychomotor Activity:  Normal  Concentration:  Fair  Recall:  [place]: Fair  Akathisia:  Negative  Handed:  Right  AIMS (if indicated):    Assets:  Communication Skills Desire for Improvement Financial Resources/Insurance  ADL's:  Intact  Cognition: WNL  Sleep:  Below average with frequent awakenings   Is the patient at risk to self?  No. Has the patient been a  risk to self in the past 6 months?  No. Has the patient been a risk to self within the distant past?  No. Is the patient a risk to others?  No. Has the patient been a risk to others in the past 6 months?  No. Has the patient been a risk to others within the distant past?  No.  Allergies:   Allergies  Allergen Reactions  . Celebrex [Celecoxib] Itching  . Codeine     REACTION: hives  . Morphine And Related   . Celebrex [Celecoxib] Rash   Current Medications: Current Outpatient Prescriptions  Medication Sig Dispense Refill  . albuterol (PROVENTIL HFA;VENTOLIN HFA) 108 (90 BASE) MCG/ACT inhaler Inhale 2 puffs into the lungs every 6 (six) hours as needed for wheezing or shortness of breath. 1 Inhaler 0  . ALPRAZolam (XANAX) 0.5 MG tablet Take 1 tablet (0.5 mg total) by mouth daily. 30 tablet 0  . azithromycin (ZITHROMAX) 250 MG tablet 2 pills on day 1, 1 pill on day 2-5 6 tablet 0  . cyclobenzaprine (FLEXERIL) 10  MG tablet Take 1 tablet (10 mg total) by mouth 3 (three) times daily as needed for muscle spasms. 90 tablet 1  . diclofenac (VOLTAREN) 75 MG EC tablet Take 1 tablet (75 mg total) by mouth 2 (two) times daily. 60 tablet 2  . estrogens, conjugated, (PREMARIN) 0.625 MG tablet Take 0.625 mg by mouth.    . gabapentin (NEURONTIN) 600 MG tablet Take 1 tablet (600 mg total) by mouth 3 (three) times daily. 90 tablet 11  . Linaclotide (LINZESS) 145 MCG CAPS capsule Take by mouth.    . traMADol (ULTRAM) 50 MG tablet Take 1 tablet (50 mg total) by mouth every 8 (eight) hours as needed. 60 tablet 3  . venlafaxine XR (EFFEXOR-XR) 150 MG 24 hr capsule Take along with 75mg  written seperately. Total dose will be 225mg . 30 capsule 0  . venlafaxine XR (EFFEXOR-XR) 75 MG 24 hr capsule Take 1 capsule (75 mg total) by mouth daily with breakfast. 30 capsule 0  . [DISCONTINUED] traZODone (DESYREL) 50 MG tablet Take 1/2 tablet qhs 15 tablet 2   No current facility-administered medications for this visit.     Previous Psychotropic Medications: Yes  SSRI name not known. Had difficult time getting off from effexor.   Substance Abuse History in the last 12 months:  No.  Consequences of Substance Abuse: Negative or NA  Medical Decision Making:  Review of Psycho-Social Stressors (1), Established Problem, Worsening (2), Review of Medication Regimen & Side Effects (2) and Review of New Medication or Change in Dosage (2)  Treatment Plan Summary: Medication management and Plan for GAD will increase effexor to 225mg    MDD continue effexor with the recommended dose increase Panic disorder or GAD. Continue xanax 0.5 mg since she is on it for long time. Expected to work on lowering dose later Insomnia; probably related to sleep apnea. Primary care to send referral for sleep study.  Also discussed sleep hygiene, cut down caffeine  call 911 or ED visit for any urgent need or suicidal tougths. Follow up in 3 weeks Recommend psychotherapy for psychosocial isssues      Mona Ayars 6/1/201610:10 AM

## 2015-01-28 NOTE — Telephone Encounter (Signed)
Patient aware.

## 2015-01-28 NOTE — Telephone Encounter (Signed)
Please refer. Thanks, WS

## 2015-01-28 NOTE — Telephone Encounter (Signed)
Referral put in for pulmonology Dr. Annamaria Boots for sleep study

## 2015-02-17 ENCOUNTER — Ambulatory Visit (HOSPITAL_COMMUNITY): Payer: Self-pay | Admitting: Licensed Clinical Social Worker

## 2015-02-23 ENCOUNTER — Ambulatory Visit (INDEPENDENT_AMBULATORY_CARE_PROVIDER_SITE_OTHER): Payer: 59 | Admitting: Licensed Clinical Social Worker

## 2015-02-23 ENCOUNTER — Telehealth (HOSPITAL_COMMUNITY): Payer: Self-pay | Admitting: *Deleted

## 2015-02-23 DIAGNOSIS — F410 Panic disorder [episodic paroxysmal anxiety] without agoraphobia: Secondary | ICD-10-CM

## 2015-02-23 DIAGNOSIS — F331 Major depressive disorder, recurrent, moderate: Secondary | ICD-10-CM

## 2015-02-23 DIAGNOSIS — F321 Major depressive disorder, single episode, moderate: Secondary | ICD-10-CM | POA: Diagnosis not present

## 2015-02-23 MED ORDER — VENLAFAXINE HCL ER 150 MG PO CP24: ORAL_CAPSULE | ORAL | Status: DC

## 2015-02-23 MED ORDER — VENLAFAXINE HCL ER 75 MG PO CP24: 75.0000 mg | ORAL_CAPSULE | Freq: Every day | ORAL | Status: DC

## 2015-02-23 NOTE — Telephone Encounter (Signed)
Pt called for a refill for Effexor 150mg  and Effexor 75mg . Per Dr De Nurse, pt is authorized for a refill for Effexor 150mg , Qty 30 and Effexor 75mg , Qty 30. Prescription was sent to [facility]. Pt has a f/u appt on 7/13. Pt verbalizes understanding.
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
## 2015-02-25 DIAGNOSIS — F410 Panic disorder [episodic paroxysmal anxiety] without agoraphobia: Secondary | ICD-10-CM | POA: Insufficient documentation

## 2015-02-25 DIAGNOSIS — F331 Major depressive disorder, recurrent, moderate: Secondary | ICD-10-CM | POA: Insufficient documentation

## 2015-03-05 ENCOUNTER — Encounter (HOSPITAL_COMMUNITY): Payer: Self-pay

## 2015-03-10 ENCOUNTER — Ambulatory Visit (INDEPENDENT_AMBULATORY_CARE_PROVIDER_SITE_OTHER): Payer: 59 | Admitting: Psychiatry

## 2015-03-10 ENCOUNTER — Encounter (HOSPITAL_COMMUNITY): Payer: Self-pay | Admitting: Psychiatry

## 2015-03-10 VITALS — BP 118/64 | HR 88 | Ht 66.0 in | Wt 230.0 lb

## 2015-03-10 DIAGNOSIS — F331 Major depressive disorder, recurrent, moderate: Secondary | ICD-10-CM

## 2015-03-10 DIAGNOSIS — F411 Generalized anxiety disorder: Secondary | ICD-10-CM

## 2015-03-10 DIAGNOSIS — F410 Panic disorder [episodic paroxysmal anxiety] without agoraphobia: Secondary | ICD-10-CM | POA: Diagnosis not present

## 2015-03-10 MED ORDER — VENLAFAXINE HCL ER 75 MG PO CP24: 75.0000 mg | ORAL_CAPSULE | Freq: Every day | ORAL | Status: DC

## 2015-03-10 MED ORDER — ALPRAZOLAM 0.5 MG PO TABS: 0.5000 mg | ORAL_TABLET | Freq: Every day | ORAL | Status: DC

## 2015-03-10 MED ORDER — VENLAFAXINE HCL ER 150 MG PO CP24: ORAL_CAPSULE | ORAL | Status: DC

## 2015-03-10 NOTE — Progress Notes (Signed)
Patient ID: Mary Cox, female   DOB: 04/08/1981, 34 y.o.   MRN: [id number]  [facility] Outpatient Follow up visit  Patient Identification: Mary Cox MRN:  [id number] Date of Evaluation:  03/10/2015 Referral Source: Primary care [facility] Chief Complaint:   Chief Complaint    Follow-up; Stress     Visit Diagnosis:    ICD-9-CM ICD-10-CM   1. Major depressive disorder, recurrent episode, moderate 296.32 F33.1   2. Panic disorder 300.01 F41.0   3. GAD (generalized anxiety disorder) 300.02 F41.1    Diagnosis:   Patient Active Problem List   Diagnosis Date Noted  . Major depressive disorder, recurrent episode, moderate [F33.1] 02/25/2015  . Panic disorder [F41.0] 02/25/2015  . Pain in joint, shoulder region [M25.519] 10/30/2014  . Biliary calculus with cholecystitis [K80.10] 10/14/2014  . Adult BMI 30+ [E66.8] 09/25/2014  . Cold intolerance [R68.89] 12/05/2012  . Other malaise and fatigue [R53.81, R53.83] 12/05/2012  . GERD (gastroesophageal reflux disease) [K21.9] 12/05/2012  . Generalized anxiety disorder [F41.1] 12/05/2012  . Anxiety [F41.9]   . ANXIETY DISORDER [F41.1] 09/15/2010  . ESOPHAGEAL REFLUX [K21.9] 09/15/2010  . HYPERSOMNIA WITH SLEEP APNEA UNSPECIFIED [G47.10, G47.30] 09/15/2010  . PALPITATIONS [R00.2] 09/15/2010   History of Present Illness:  Patient is a 34 years old currently separated Caucasian female living by herself and is currently not working. Initially referred for the following  "Endorses depression getting worse for the last 1 year. She has had symptoms of decreased energy decreased concentration, anhedonia withdrawn disturbed appetite sleep and feeling of hopelessness at times. She also endorsed excessive worries, disturbed sleep muscle tightening at night. Worries are excessive and unreasonable at times including some palpitations and panic attacks which has been recent. She has been evaluated by primary care and EKG and her workup was benign. "  Last  visit we increased effexor to 225mg  and continued xanax low dose. There is some improvement in depression. Still has blah days and is trying to look for job. Handling the split better. Also has been taking trazadone uptil 100mg  for sleep otherwise feels insomnia and difficulty mantaining sleep.   Aggravating factors; split from her husband 1 year ago after 35 years of marriage. Job stress  Modifying factors; her son 29  years of age.  She does not endorse having any delusions or hallucinations. There is no paranoia or hallucinations when she is getting depressed.  She does not endorse using drugs or alcohol.  Also has difficulty sleeping and maintaining sleep. Has been tested for sleep apnea but that is being many years ago. She has pending appointment for sleep study.   Elements:  Location:  depression and anxiety. Quality:  moderate. Severity:  5/10. depressio. 10 being no depression. anxiety is 7/10. 10 being extreme anxiety. Timing:  job stress or at evening. Duration:  more then one year intermittently. Context:  split one year ago after 35 years of marriage. job stress. some lega issue one year ago.   Depression improved to 6/10 Associated Signs/Symptoms: Depression Symptoms:  depressed mood, anhedonia, psychomotor retardation, fatigue, anxiety, panic attacks, (Hypo) Manic Symptoms:  Impulsivity, Anxiety Symptoms:  Excessive Worry, Psychotic Symptoms:  denies PTSD Symptoms: NA  Past Medical History:  Past Medical History  Diagnosis Date  . Anxiety     [facility], dr watts  . Epigastric pain   . Sleep apnea   . Chest pain   . Anxiety   . GERD (gastroesophageal reflux disease)   . Depression   .  Cancer   . Ovarian cancer     Past Surgical History  Procedure Laterality Date  . Abdominal hysterectomy    . Appendectomy    . Hernia repair     Family History:  Family History  Problem Relation Age of Onset  . Coronary artery disease Neg Hx   .  Depression Maternal Aunt   . Anxiety disorder Maternal Aunt   . Anxiety disorder Maternal Grandmother    Social History:   History   Social History  . Marital Status: Married    Spouse Name: N/A  . Number of Children: N/A  . Years of Education: N/A   Social History Main Topics  . Smoking status: Never Smoker   . Smokeless tobacco: Not on file  . Alcohol Use: No  . Drug Use: No  . Sexual Activity:    Partners: Male   Other Topics Concern  . None   Social History Narrative   ** Merged History Encounter **   Lives with son in a one story home.     Works in a Engineer, maintenance.    Education: high school      Additional Social History: Seperated white female. Has 73 years old son. Works full time  Musculoskeletal: Strength & Muscle Tone: within normal limits Gait & Station: normal Patient leans: no lean  Psychiatric Specialty Exam: HPI  Review of Systems  Respiratory: Negative for cough.   Cardiovascular: Negative for chest pain.  Gastrointestinal: Negative for nausea.  Skin: Negative for itching.  Neurological: Negative for tremors and headaches.  Psychiatric/Behavioral: Positive for depression. The patient has insomnia.     Blood pressure 118/64, pulse 88, height 5\' 6"  (1.676 m), weight 230 lb (104.327 kg), SpO2 98 %.Body mass index is 37.14 kg/(m^2).  General Appearance: Casual  Eye Contact:  Fair  Speech:  Normal Rate  Volume:  Normal  Mood:  Somewhat dysthymic but not hopeless  Affect:  Congruent  Thought Process:  Coherent  Orientation:  Full (Time, Place, and Person)  Thought Content:  Rumination  Suicidal Thoughts:  No  Homicidal Thoughts:  No  Memory:  Immediate;   Fair Recent;   Fair  Judgement:  Fair  Insight:  Shallow  Psychomotor Activity:  Normal  Concentration:  Fair  Recall:  [place]: Fair  Akathisia:  Negative  Handed:  Right  AIMS (if indicated):    Assets:  Communication  Skills Desire for Improvement Financial Resources/Insurance  ADL's:  Intact  Cognition: WNL  Sleep:  Below average with frequent awakenings   Is the patient at risk to self?  No.  Allergies:   Allergies  Allergen Reactions  . Celebrex [Celecoxib] Itching  . Codeine     REACTION: hives  . Morphine And Related   . Celebrex [Celecoxib] Rash   Current Medications: Current Outpatient Prescriptions  Medication Sig Dispense Refill  . albuterol (PROVENTIL HFA;VENTOLIN HFA) 108 (90 BASE) MCG/ACT inhaler Inhale 2 puffs into the lungs every 6 (six) hours as needed for wheezing or shortness of breath. 1 Inhaler 0  . ALPRAZolam (XANAX) 0.5 MG tablet Take 1 tablet (0.5 mg total) by mouth daily. 30 tablet 0  . azithromycin (ZITHROMAX) 250 MG tablet 2 pills on day 1, 1 pill on day 2-5 6 tablet 0  . cyclobenzaprine (FLEXERIL) 10 MG tablet Take 1 tablet (10 mg total) by mouth 3 (three) times daily as needed for muscle spasms. 90 tablet 1  .  diclofenac (VOLTAREN) 75 MG EC tablet Take 1 tablet (75 mg total) by mouth 2 (two) times daily. 60 tablet 2  . gabapentin (NEURONTIN) 600 MG tablet Take 1 tablet (600 mg total) by mouth 3 (three) times daily. 90 tablet 11  . Linaclotide (LINZESS) 145 MCG CAPS capsule Take by mouth.    . traMADol (ULTRAM) 50 MG tablet Take 1 tablet (50 mg total) by mouth every 8 (eight) hours as needed. 60 tablet 3  . venlafaxine XR (EFFEXOR-XR) 150 MG 24 hr capsule Take along with 75mg  written seperately. Total dose will be 225mg . 30 capsule 1  . venlafaxine XR (EFFEXOR-XR) 75 MG 24 hr capsule Take 1 capsule (75 mg total) by mouth daily with breakfast. 30 capsule 1  . estrogens, conjugated, (PREMARIN) 0.625 MG tablet Take 0.625 mg by mouth.    . [DISCONTINUED] traZODone (DESYREL) 50 MG tablet Take 1/2 tablet qhs 15 tablet 2   No current facility-administered medications for this visit.    Previous Psychotropic Medications: Yes  SSRI name not known. Had difficult time getting  off from effexor.   Substance Abuse History in the last 12 months:  No.  Consequences of Substance Abuse: Negative or NA  Medical Decision Making:  Review of Psycho-Social Stressors (1), Established Problem, Worsening (2), Review of Medication Regimen & Side Effects (2) and Review of New Medication or Change in Dosage (2)  Treatment Plan Summary: Continue effexor 225mg . We may consdier to increase dose further but for now patient feels to keep it same.   MDD continue effexor  Panic disorder or GAD. Continue xanax 0.5 mg since she is on it for long time. Expected to work on lowering dose later Insomnia; probably related to sleep apnea. Primary care to send referral for sleep study.  Also discussed sleep hygiene, cut down caffeine  call 911 or ED visit for any urgent need or suicidal tougths. Follow up in 3 weeks Recommend psychotherapy for psychosocial isssues which she will follow with Sarah.      Bartosz Luginbill 7/13/201610:35 AM

## 2015-03-22 ENCOUNTER — Ambulatory Visit (INDEPENDENT_AMBULATORY_CARE_PROVIDER_SITE_OTHER): Payer: 59 | Admitting: Licensed Clinical Social Worker

## 2015-03-22 DIAGNOSIS — F410 Panic disorder [episodic paroxysmal anxiety] without agoraphobia: Secondary | ICD-10-CM | POA: Diagnosis not present

## 2015-03-22 DIAGNOSIS — F331 Major depressive disorder, recurrent, moderate: Secondary | ICD-10-CM | POA: Diagnosis not present

## 2015-03-22 NOTE — Progress Notes (Signed)
   THERAPIST PROGRESS NOTE  Session Time: 2:25pm-3:15pm  Participation Level: Active  Behavioral Response: CasualAlertEuthymic  Type of Therapy: Individual Therapy  Treatment Goals addressed: Anxiety  Interventions: psychoeducation  Suicidal/Homicidal: Denied both  Therapist Interventions:   Had patient complete a GAD-7.    Reviewed patient's diagnoses.   Educated patient about symptoms of depression.     Summary:  No change in GAD-7 score since intake assessment.   Reported she started a new full time job last week at an [employer].  Anticipates working will help her develop a healthier daily routine and maintain a sense of purpose.   Somewhat familiar with the symptoms of depression.  Acknowledged that after 4 years of being on Xanax it is no longer effective for coping with panic attacks.  Plans to inquire from the psychiatrist about prescribing an alternative medication for anxiety.         Plan: Return again in approximately 2 weeks.  Will educate about panic attacks.  Diagnosis:  Major Depressive Disorder, recurrent, moderate Panic Disorder   Armandina Stammer 03/22/2015

## 2015-03-23 ENCOUNTER — Other Ambulatory Visit: Payer: Self-pay | Admitting: Physician Assistant

## 2015-03-31 ENCOUNTER — Ambulatory Visit: Payer: Managed Care, Other (non HMO) | Admitting: Physician Assistant

## 2015-04-05 ENCOUNTER — Ambulatory Visit (HOSPITAL_COMMUNITY): Payer: Self-pay | Admitting: Licensed Clinical Social Worker

## 2015-04-05 ENCOUNTER — Encounter: Payer: Self-pay | Admitting: Family Medicine

## 2015-04-12 ENCOUNTER — Other Ambulatory Visit (HOSPITAL_COMMUNITY): Payer: Self-pay | Admitting: Psychiatry

## 2015-04-13 ENCOUNTER — Institutional Professional Consult (permissible substitution): Payer: Self-pay | Admitting: Pulmonary Disease

## 2015-04-14 ENCOUNTER — Ambulatory Visit (HOSPITAL_COMMUNITY): Payer: Self-pay | Admitting: Psychiatry

## 2015-04-16 ENCOUNTER — Ambulatory Visit (INDEPENDENT_AMBULATORY_CARE_PROVIDER_SITE_OTHER): Payer: 59 | Admitting: Psychiatry

## 2015-04-16 ENCOUNTER — Encounter (HOSPITAL_COMMUNITY): Payer: Self-pay | Admitting: Psychiatry

## 2015-04-16 DIAGNOSIS — F410 Panic disorder [episodic paroxysmal anxiety] without agoraphobia: Secondary | ICD-10-CM

## 2015-04-16 DIAGNOSIS — F331 Major depressive disorder, recurrent, moderate: Secondary | ICD-10-CM | POA: Diagnosis not present

## 2015-04-16 DIAGNOSIS — F411 Generalized anxiety disorder: Secondary | ICD-10-CM

## 2015-04-16 MED ORDER — ALPRAZOLAM 0.5 MG PO TABS: 0.5000 mg | ORAL_TABLET | Freq: Every day | ORAL | Status: DC

## 2015-04-16 MED ORDER — TRAZODONE HCL 50 MG PO TABS: ORAL_TABLET | ORAL | Status: DC

## 2015-04-16 MED ORDER — VENLAFAXINE HCL ER 150 MG PO CP24: ORAL_CAPSULE | ORAL | Status: DC

## 2015-04-16 MED ORDER — VENLAFAXINE HCL ER 75 MG PO CP24: 75.0000 mg | ORAL_CAPSULE | Freq: Every day | ORAL | Status: DC

## 2015-04-16 NOTE — Progress Notes (Signed)
Patient ID: Mary Cox, female   DOB: 1981/05/05, 34 y.o.   MRN: [id number]  [facility] Outpatient Follow up visit  Patient Identification: Mary Cox MRN:  [id number] Date of Evaluation:  04/16/2015 Referral Source: Primary care [facility] Chief Complaint:   Chief Complaint    Follow-up     Visit Diagnosis:    ICD-9-CM ICD-10-CM   1. Major depressive disorder, recurrent episode, moderate 296.32 F33.1   2. Panic disorder 300.01 F41.0   3. GAD (generalized anxiety disorder) 300.02 F41.1    Diagnosis:   Patient Active Problem List   Diagnosis Date Noted  . Major depressive disorder, recurrent episode, moderate [F33.1] 02/25/2015  . Panic disorder [F41.0] 02/25/2015  . Pain in joint, shoulder region [M25.519] 10/30/2014  . Biliary calculus with cholecystitis [K80.10] 10/14/2014  . Adult BMI 30+ [E66.8] 09/25/2014  . Cold intolerance [R68.89] 12/05/2012  . Other malaise and fatigue [R53.81, R53.83] 12/05/2012  . GERD (gastroesophageal reflux disease) [K21.9] 12/05/2012  . Generalized anxiety disorder [F41.1] 12/05/2012  . Anxiety [F41.9]   . ANXIETY DISORDER [F41.1] 09/15/2010  . ESOPHAGEAL REFLUX [K21.9] 09/15/2010  . HYPERSOMNIA WITH SLEEP APNEA UNSPECIFIED [G47.10, G47.30] 09/15/2010  . PALPITATIONS [R00.2] 09/15/2010   History of Present Illness:  Patient is a 34 years old currently separated Caucasian female living by herself and is currently not working. Initially referred for the following  "Endorses depression getting worse for the last 1 year. She has had symptoms of decreased energy decreased concentration, anhedonia withdrawn disturbed appetite sleep and feeling of hopelessness at times. She also endorsed excessive worries, disturbed sleep muscle tightening at night. Worries are excessive and unreasonable at times including some palpitations and panic attacks which has been recent. She has been evaluated by primary care and EKG and her workup was benign. "  Depression:  better.  GAD: worries about her son and residence. They have to move, she doesn't want to change his school Tolerating medications. Less and infrequent panic attacks.  Severity of depression: 7/10. 10 being happy. Aggravating factors; split from her husband  In 2015  after 76 years of marriage. Job stress  Modifying factors; her son 52  years of age.  She does not endorse having any delusions or hallucinations. There is no paranoia or hallucinations when she is getting depressed.  She does not endorse using drugs or alcohol.  Also has difficulty sleeping and maintaining sleep. Has been tested for sleep apnea but that is being many years ago. She has pending appointment for sleep study.   (Hypo) Manic Symptoms:  Impulsivity, Anxiety Symptoms:  Excessive Worry, Psychotic Symptoms:  denies PTSD Symptoms: NA  Past Medical History:  Past Medical History  Diagnosis Date  . Anxiety     [facility], dr watts  . Epigastric pain   . Sleep apnea   . Chest pain   . Anxiety   . GERD (gastroesophageal reflux disease)   . Depression   . Cancer   . Ovarian cancer     Past Surgical History  Procedure Laterality Date  . Abdominal hysterectomy    . Appendectomy    . Hernia repair     Family History:  Family History  Problem Relation Age of Onset  . Coronary artery disease Neg Hx   . Depression Maternal Aunt   . Anxiety disorder Maternal Aunt   . Anxiety disorder Maternal Grandmother    Social History:   Social History   Social History  . Marital Status: Married  Spouse Name: N/A  . Number of Children: N/A  . Years of Education: N/A   Social History Main Topics  . Smoking status: Never Smoker   . Smokeless tobacco: Not on file  . Alcohol Use: No  . Drug Use: No  . Sexual Activity:    Partners: Male   Other Topics Concern  . Not on file   Social History Narrative   ** Merged History Encounter **   Lives with son in a one story home.     Works in a  Engineer, maintenance.    Education: high school      Additional Social History: Seperated white female. Has 51 years old son. Works full time  Musculoskeletal: Strength & Muscle Tone: within normal limits Gait & Station: normal Patient leans: no lean  Psychiatric Specialty Exam: HPI  Review of Systems  Respiratory: Negative for cough.   Cardiovascular: Negative for chest pain.  Gastrointestinal: Negative for nausea.  Skin: Negative for itching.  Neurological: Negative for tremors and headaches.    There were no vitals taken for this visit.There is no weight on file to calculate BMI.  General Appearance: Casual  Eye Contact:  Fair  Speech:  Normal Rate  Volume:  Normal  Mood:  euthymic  Affect:  Congruent  Thought Process:  Coherent  Orientation:  Full (Time, Place, and Person)  Thought Content:  Rumination  Suicidal Thoughts:  No  Homicidal Thoughts:  No  Memory:  Immediate;   Fair Recent;   Fair  Judgement:  Fair  Insight:  Shallow  Psychomotor Activity:  Normal  Concentration:  Fair  Recall:  [place]: Fair  Akathisia:  Negative  Handed:  Right  AIMS (if indicated):    Assets:  Communication Skills Desire for Improvement Financial Resources/Insurance  ADL's:  Intact  Cognition: WNL  Sleep:  Below average with frequent awakenings   Is the patient at risk to self?  No.  Allergies:   Allergies  Allergen Reactions  . Celebrex [Celecoxib] Itching  . Codeine     REACTION: hives  . Morphine And Related   . Celebrex [Celecoxib] Rash   Current Medications: Current Outpatient Prescriptions  Medication Sig Dispense Refill  . albuterol (PROVENTIL HFA;VENTOLIN HFA) 108 (90 BASE) MCG/ACT inhaler Inhale 2 puffs into the lungs every 6 (six) hours as needed for wheezing or shortness of breath. 1 Inhaler 0  . ALPRAZolam (XANAX) 0.5 MG tablet Take 1 tablet (0.5 mg total) by mouth daily. 30 tablet 1  . azithromycin  (ZITHROMAX) 250 MG tablet 2 pills on day 1, 1 pill on day 2-5 6 tablet 0  . cyclobenzaprine (FLEXERIL) 10 MG tablet Take 1 tablet (10 mg total) by mouth 3 (three) times daily as needed for muscle spasms. 90 tablet 1  . diclofenac (VOLTAREN) 75 MG EC tablet Take 1 tablet (75 mg total) by mouth 2 (two) times daily. 60 tablet 2  . estrogens, conjugated, (PREMARIN) 0.625 MG tablet Take 0.625 mg by mouth.    . gabapentin (NEURONTIN) 600 MG tablet Take 1 tablet (600 mg total) by mouth 3 (three) times daily. 90 tablet 11  . Linaclotide (LINZESS) 145 MCG CAPS capsule Take by mouth.    . traMADol (ULTRAM) 50 MG tablet Take 1 tablet (50 mg total) by mouth every 8 (eight) hours as needed. 60 tablet 3  . traZODone (DESYREL) 50 MG tablet Take one at night 30 tablet 1  . venlafaxine  XR (EFFEXOR-XR) 150 MG 24 hr capsule Take along with 75mg  written seperately. Total dose will be 225mg . 30 capsule 1  . venlafaxine XR (EFFEXOR-XR) 75 MG 24 hr capsule Take 1 capsule (75 mg total) by mouth daily with breakfast. 30 capsule 1   No current facility-administered medications for this visit.    Previous Psychotropic Medications: Yes  SSRI name not known. Had difficult time getting off from effexor.   Substance Abuse History in the last 12 months:  No.  Consequences of Substance Abuse: Negative or NA  Medical Decision Making:  Review of Psycho-Social Stressors (1), Established Problem, Worsening (2), Review of Medication Regimen & Side Effects (2) and Review of New Medication or Change in Dosage (2)  Treatment Plan Summary: Continue effexor 225mg . We may consdier to increase dose further but for now patient feels to keep it same.   MDD continue effexor  Panic disorder or GAD. Continue xanax 0.5 mg since she is on it for long time. Expected to work on lowering dose later No change in doses.  Insomnia; probably related to sleep apnea. Primary care to send referral for sleep study.  Also discussed sleep hygiene,  cut down caffeine  call 911 or ED visit for any urgent need or suicidal tougths. Follow up in 6 to 8 weeks.  Recommend psychotherapy for psychosocial isssues which she will follow with Sarah.      Mary Cox 8/19/20163:28 PM

## 2015-04-19 ENCOUNTER — Ambulatory Visit (HOSPITAL_COMMUNITY): Payer: Self-pay | Admitting: Licensed Clinical Social Worker

## 2015-05-11 ENCOUNTER — Ambulatory Visit (HOSPITAL_COMMUNITY): Payer: Self-pay | Admitting: Psychiatry

## 2015-05-14 ENCOUNTER — Ambulatory Visit (HOSPITAL_COMMUNITY): Payer: Self-pay | Admitting: Licensed Clinical Social Worker

## 2015-06-12 ENCOUNTER — Other Ambulatory Visit: Payer: Self-pay | Admitting: Family Medicine

## 2015-06-14 NOTE — Telephone Encounter (Signed)
Last seen 01/04/15 Mary Cox

## 2015-06-17 ENCOUNTER — Other Ambulatory Visit (HOSPITAL_COMMUNITY): Payer: Self-pay | Admitting: Psychiatry

## 2015-06-18 ENCOUNTER — Ambulatory Visit (HOSPITAL_COMMUNITY): Payer: Self-pay | Admitting: Psychiatry

## 2015-06-25 ENCOUNTER — Encounter (HOSPITAL_COMMUNITY): Payer: Self-pay | Admitting: Psychiatry

## 2015-06-25 ENCOUNTER — Ambulatory Visit (INDEPENDENT_AMBULATORY_CARE_PROVIDER_SITE_OTHER): Payer: 59 | Admitting: Psychiatry

## 2015-06-25 VITALS — BP 124/70 | HR 73 | Ht 66.0 in | Wt 226.0 lb

## 2015-06-25 DIAGNOSIS — G4700 Insomnia, unspecified: Secondary | ICD-10-CM

## 2015-06-25 DIAGNOSIS — F411 Generalized anxiety disorder: Secondary | ICD-10-CM | POA: Diagnosis not present

## 2015-06-25 DIAGNOSIS — F331 Major depressive disorder, recurrent, moderate: Secondary | ICD-10-CM

## 2015-06-25 DIAGNOSIS — F410 Panic disorder [episodic paroxysmal anxiety] without agoraphobia: Secondary | ICD-10-CM | POA: Diagnosis not present

## 2015-06-25 MED ORDER — ALPRAZOLAM 0.5 MG PO TABS: 0.5000 mg | ORAL_TABLET | Freq: Every day | ORAL | Status: DC

## 2015-06-25 MED ORDER — VENLAFAXINE HCL ER 75 MG PO CP24: 75.0000 mg | ORAL_CAPSULE | Freq: Every day | ORAL | Status: DC

## 2015-06-25 MED ORDER — FLUOXETINE HCL 10 MG PO TABS: 10.0000 mg | ORAL_TABLET | Freq: Every day | ORAL | Status: DC

## 2015-06-25 NOTE — Patient Instructions (Signed)
Lower effexor to 75mg . Already on lower dose now Start prozac 10mg  qd  Avoid regular use of xanax Stop trazadone

## 2015-06-25 NOTE — Progress Notes (Signed)
Patient ID: MERLA SAWKA, female   DOB: 09-15-1980, 34 y.o.   MRN: [id number]  [facility] Outpatient Follow up visit  Patient Identification: SUSEN HASKEW MRN:  [id number] Date of Evaluation:  06/25/2015 Referral Source: Primary care [facility] Chief Complaint:   Chief Complaint    Follow-up     Visit Diagnosis:    ICD-9-CM ICD-10-CM   1. Major depressive disorder, recurrent episode, moderate (HCC) 296.32 F33.1   2. Panic disorder 300.01 F41.0   3. GAD (generalized anxiety disorder) 300.02 F41.1   4. Insomnia 780.52 G47.00    Diagnosis:   Patient Active Problem List   Diagnosis Date Noted  . Major depressive disorder, recurrent episode, moderate ([place]) [F33.1] 02/25/2015  . Panic disorder [F41.0] 02/25/2015  . Pain in joint, shoulder region [M25.519] 10/30/2014  . Biliary calculus with cholecystitis [K80.10] 10/14/2014  . Adult BMI 30+ [E66.8] 09/25/2014  . Cold intolerance [R68.89] 12/05/2012  . Other malaise and fatigue [R53.81, R53.83] 12/05/2012  . GERD (gastroesophageal reflux disease) [K21.9] 12/05/2012  . Generalized anxiety disorder [F41.1] 12/05/2012  . Anxiety [F41.9]   . ANXIETY DISORDER [F41.1] 09/15/2010  . ESOPHAGEAL REFLUX [K21.9] 09/15/2010  . HYPERSOMNIA WITH SLEEP APNEA UNSPECIFIED [G47.10, G47.30] 09/15/2010  . PALPITATIONS [R00.2] 09/15/2010   History of Present Illness:  Patient is a 34 years old currently separated Caucasian female living by herself and is currently not working. Initially referred for the following  "Endorses depression getting worse for the last 1 year. She has had symptoms of decreased energy decreased concentration, anhedonia withdrawn disturbed appetite sleep and feeling of hopelessness at times. She also endorsed excessive worries, disturbed sleep muscle tightening at night. Worries are excessive and unreasonable at times including some palpitations and panic attacks which has been recent. She has been evaluated by primary care and EKG and  her workup was benign. "  She is following up with her primary care physician Dr. Andreas Ohm and is in the process of starting injection while in B-12 and phentermine. For this reason she is already cut down her Effexor slowly now to 75 mg she is not expressing any significant withdrawal symptoms. She is planning to work on weigh loss.   Depression: not worsened GAD: worries about her son but improving.  Patient is working and it has helped. Tolerating medications. Less and infrequent panic attacks.  Insomnia: Since she is working she is not taking trazodone that much and is able to sleep but she does have to take Xanax as her during the evening or at night for helping anxiety and sleep Severity of depression: 7/10. 10 being happy. Aggravating factors; split from her husband  In 2015  after 43 years of marriage. Job stress  Modifying factors; her son 61  years of age.  She does not endorse having any delusions or hallucinations. There is no paranoia or hallucinations when she is getting depressed.  She does not endorse using drugs or alcohol.  Also has difficulty sleeping and maintaining sleep. Has been tested for sleep apnea but that is being many years ago. She has pending appointment for sleep study.   Psychotic Symptoms:  denies PTSD Symptoms: NA  Past Medical History:  Past Medical History  Diagnosis Date  . Anxiety     [facility], dr watts  . Epigastric pain   . Sleep apnea   . Chest pain   . Anxiety   . GERD (gastroesophageal reflux disease)   . Depression   . Cancer ([place])   .  Ovarian cancer [facility])     Past Surgical History  Procedure Laterality Date  . Abdominal hysterectomy    . Appendectomy    . Hernia repair     Family History:  Family History  Problem Relation Age of Onset  . Coronary artery disease Neg Hx   . Depression Maternal Aunt   . Anxiety disorder Maternal Aunt   . Anxiety disorder Maternal Grandmother    Social History:   Social History    Social History  . Marital Status: Married    Spouse Name: N/A  . Number of Children: N/A  . Years of Education: N/A   Social History Main Topics  . Smoking status: Never Smoker   . Smokeless tobacco: None  . Alcohol Use: No  . Drug Use: No  . Sexual Activity:    Partners: Male   Other Topics Concern  . None   Social History Narrative   ** Merged History Encounter **   Lives with son in a one story home.     Works in a Engineer, maintenance.    Education: high school      Additional Social History: Seperated white female. Has 28 years old son. Works full time  Musculoskeletal: Strength & Muscle Tone: within normal limits Gait & Station: normal Patient leans: no lean  Psychiatric Specialty Exam: HPI  Review of Systems  Constitutional: Negative for fever.  Respiratory: Negative for cough.   Cardiovascular: Negative for chest pain.  Gastrointestinal: Negative for nausea.  Skin: Negative for itching.  Neurological: Negative for tremors and headaches.  Psychiatric/Behavioral: Negative for depression and hallucinations. The patient is not nervous/anxious.     Blood pressure 124/70, pulse 73, height 5\' 6"  (1.676 m), weight 226 lb (102.513 kg), SpO2 98 %.Body mass index is 36.49 kg/(m^2).  General Appearance: Casual  Eye Contact:  Fair  Speech:  Normal Rate  Volume:  Normal  Mood:  euthymic  Affect:  Congruent  Thought Process:  Coherent  Orientation:  Full (Time, Place, and Person)  Thought Content:  Rumination  Suicidal Thoughts:  No  Homicidal Thoughts:  No  Memory:  Immediate;   Fair Recent;   Fair  Judgement:  Fair  Insight:  Shallow  Psychomotor Activity:  Normal  Concentration:  Fair  Recall:  [place]: Fair  Akathisia:  Negative  Handed:  Right  AIMS (if indicated):    Assets:  Communication Skills Desire for Improvement Financial Resources/Insurance  ADL's:  Intact  Cognition: WNL  Sleep:   Below average with frequent awakenings   Is the patient at risk to self?  No.  Allergies:   Allergies  Allergen Reactions  . Celebrex [Celecoxib] Itching  . Codeine     REACTION: hives  . Morphine And Related   . Celebrex [Celecoxib] Rash   Current Medications: Current Outpatient Prescriptions  Medication Sig Dispense Refill  . ALPRAZolam (XANAX) 0.5 MG tablet Take 1 tablet (0.5 mg total) by mouth daily. 30 tablet 0  . azithromycin (ZITHROMAX) 250 MG tablet 2 pills on day 1, 1 pill on day 2-5 6 tablet 0  . cyclobenzaprine (FLEXERIL) 10 MG tablet Take 1 tablet (10 mg total) by mouth 3 (three) times daily as needed for muscle spasms. 90 tablet 1  . diclofenac (VOLTAREN) 75 MG EC tablet Take 1 tablet (75 mg total) by mouth 2 (two) times daily. 60 tablet 2  . gabapentin (NEURONTIN) 600 MG tablet TAKE ONE TABLET  BY MOUTH THREE TIMES DAILY 90 tablet 0  . Linaclotide (LINZESS) 145 MCG CAPS capsule Take by mouth.    . traMADol (ULTRAM) 50 MG tablet Take 1 tablet (50 mg total) by mouth every 8 (eight) hours as needed. 60 tablet 3  . venlafaxine XR (EFFEXOR-XR) 75 MG 24 hr capsule Take 1 capsule (75 mg total) by mouth daily with breakfast. 30 capsule 0  . [DISCONTINUED] traZODone (DESYREL) 50 MG tablet Take one at night 30 tablet 1  . estrogens, conjugated, (PREMARIN) 0.625 MG tablet Take 0.625 mg by mouth.    Marland Kitchen FLUoxetine (PROZAC) 10 MG tablet Take 1 tablet (10 mg total) by mouth daily. 30 tablet 0   No current facility-administered medications for this visit.    Previous Psychotropic Medications: Yes  SSRI name not known. Had difficult time getting off from effexor.   Substance Abuse History in the last 12 months:  No.  Consequences of Substance Abuse: Negative or NA  Medical Decision Making:  Review of Psycho-Social Stressors (1), Established Problem, Worsening (2), Review of Medication Regimen & Side Effects (2) and Review of New Medication or Change in Dosage (2)  Treatment Plan  Summary: MDD continue effexor but at the current lower dose of 75mg  to avoid weight gain or itneraction with weight loss regimen Start low dose prozac 10mg  since she is on lower dose of effexor for now.  Panic disorder or GAD. Continue xanax 0.5 mg since she is on it for long time. Expected to work on lowering dose later Avoid regular use  Insomnia; probably related to sleep apnea. Stop trazadone. Sleep is improving. She takes prn xanax. Also discussed sleep hygiene, cut down caffeine  call 911 or ED visit for any urgent need or suicidal tougths. Follow up in 4 weeks        Gwendolyne Welford 10/28/20169:58 AM

## 2015-07-08 ENCOUNTER — Telehealth: Payer: Self-pay | Admitting: Family Medicine

## 2015-07-08 DIAGNOSIS — G629 Polyneuropathy, unspecified: Secondary | ICD-10-CM

## 2015-07-08 NOTE — Telephone Encounter (Signed)
Please refer as requested 

## 2015-07-09 ENCOUNTER — Telehealth: Payer: Self-pay | Admitting: Family Medicine

## 2015-07-09 NOTE — Telephone Encounter (Signed)
Referral put in for neurologist.

## 2015-07-09 NOTE — Telephone Encounter (Signed)
Pt has 2 calls open, will close this encounter.

## 2015-07-19 ENCOUNTER — Other Ambulatory Visit (HOSPITAL_COMMUNITY): Payer: Self-pay | Admitting: Psychiatry

## 2015-07-20 ENCOUNTER — Other Ambulatory Visit: Payer: Self-pay | Admitting: Family Medicine

## 2015-07-20 ENCOUNTER — Telehealth: Payer: Self-pay | Admitting: Family Medicine

## 2015-07-20 NOTE — Telephone Encounter (Signed)
Last seen 01/27/15  Dr Livia Snellen   If approved print

## 2015-07-20 NOTE — Telephone Encounter (Signed)
Received medication request from [facility] for Prozac 10mg . Per Dr. De Nurse, pt is authorized for a refill for Prozac 10mg , #30. Prescription was sent to pharmacy. Pt has a f/u appt on 07/29/15. Called and informed pt of prescription status. Pt verbalizes understanding.

## 2015-07-29 ENCOUNTER — Ambulatory Visit (INDEPENDENT_AMBULATORY_CARE_PROVIDER_SITE_OTHER): Payer: 59 | Admitting: Psychiatry

## 2015-07-29 ENCOUNTER — Encounter (HOSPITAL_COMMUNITY): Payer: Self-pay | Admitting: Psychiatry

## 2015-07-29 VITALS — BP 126/70 | HR 78 | Ht 66.0 in | Wt 220.0 lb

## 2015-07-29 DIAGNOSIS — F331 Major depressive disorder, recurrent, moderate: Secondary | ICD-10-CM

## 2015-07-29 DIAGNOSIS — F411 Generalized anxiety disorder: Secondary | ICD-10-CM

## 2015-07-29 DIAGNOSIS — F410 Panic disorder [episodic paroxysmal anxiety] without agoraphobia: Secondary | ICD-10-CM

## 2015-07-29 MED ORDER — VENLAFAXINE HCL ER 75 MG PO CP24: 75.0000 mg | ORAL_CAPSULE | Freq: Every day | ORAL | Status: DC

## 2015-07-29 MED ORDER — ALPRAZOLAM 0.5 MG PO TABS: 0.5000 mg | ORAL_TABLET | Freq: Every day | ORAL | Status: DC

## 2015-07-29 NOTE — Progress Notes (Signed)
Patient ID: Mary Cox, female   DOB: August 30, 1980, 34 y.o.   MRN: [id number]  [facility] Outpatient Follow up visit  Patient Identification: Mary Cox MRN:  [id number] Date of Evaluation:  07/29/2015 Referral Source: Primary care [facility] Chief Complaint:   Chief Complaint    Follow-up     Visit Diagnosis:    ICD-9-CM ICD-10-CM   1. Major depressive disorder, recurrent episode, moderate (HCC) 296.32 F33.1   2. Panic disorder 300.01 F41.0   3. GAD (generalized anxiety disorder) 300.02 F41.1    Diagnosis:   Patient Active Problem List   Diagnosis Date Noted  . Major depressive disorder, recurrent episode, moderate ([place]) [F33.1] 02/25/2015  . Panic disorder [F41.0] 02/25/2015  . Pain in joint, shoulder region [M25.519] 10/30/2014  . Biliary calculus with cholecystitis [K80.10] 10/14/2014  . Adult BMI 30+ [E66.8] 09/25/2014  . Cold intolerance [R68.89] 12/05/2012  . Other malaise and fatigue [R53.81, R53.83] 12/05/2012  . GERD (gastroesophageal reflux disease) [K21.9] 12/05/2012  . Generalized anxiety disorder [F41.1] 12/05/2012  . Anxiety [F41.9]   . ANXIETY DISORDER [F41.1] 09/15/2010  . ESOPHAGEAL REFLUX [K21.9] 09/15/2010  . HYPERSOMNIA WITH SLEEP APNEA UNSPECIFIED [G47.10, G47.30] 09/15/2010  . PALPITATIONS [R00.2] 09/15/2010   History of Present Illness:  Patient is a 34 years old currently separated Caucasian female living by herself and is currently not working. Initially presented and was referred for the following  "Endorses depression getting worse for the last 1 year. She has had symptoms of decreased energy decreased concentration, anhedonia withdrawn disturbed appetite sleep and feeling of hopelessness at times. She also endorsed excessive worries, disturbed sleep muscle tightening at night. Worries are excessive and unreasonable at times including some palpitations and panic attacks which has been recent. She has been evaluated by primary care and EKG and her workup  was benign. "  She is following up with her primary care physician Dr. Andreas Ohm and is in the process of starting injection while in B-12 and phentermine. For this reason she cut down the Effexor last visit to 75 mg. We introduced Prozac and dose of 10 mg to balance that out. This has helped she is not feeling hopeless or depressed.  Depression: not worsened GAD: worries about her son but improving.  Patient is working and it has helped. Tolerating medications. Less and infrequent panic attacks.  Insomnia: Since she is working she is not taking trazodone that much and is able to sleep but she does have to take Xanax as her during the evening or at night for helping anxiety and sleep Severity of depression: 7/10. 10 being happy. Aggravating factors; split from her husband  In 2015  after 22 years of marriage. Job stress  Modifying factors; her son 21  years of age.  She does not endorse having any delusions or hallucinations. There is no paranoia or hallucinations when she is getting depressed.  She does not endorse using drugs or alcohol.  Also has difficulty sleeping and maintaining sleep. Has been tested for sleep apnea but that is being many years ago. She has pending appointment for sleep study.   Psychotic Symptoms:  denies PTSD Symptoms: NA  Past Medical History:  Past Medical History  Diagnosis Date  . Anxiety     [facility], dr watts  . Epigastric pain   . Sleep apnea   . Chest pain   . Anxiety   . GERD (gastroesophageal reflux disease)   . Depression   . Cancer ([place])   .  Ovarian cancer [facility])     Past Surgical History  Procedure Laterality Date  . Abdominal hysterectomy    . Appendectomy    . Hernia repair     Family History:  Family History  Problem Relation Age of Onset  . Coronary artery disease Neg Hx   . Depression Maternal Aunt   . Anxiety disorder Maternal Aunt   . Anxiety disorder Maternal Grandmother    Social History:   Social History    Social History  . Marital Status: Married    Spouse Name: N/A  . Number of Children: N/A  . Years of Education: N/A   Social History Main Topics  . Smoking status: Never Smoker   . Smokeless tobacco: None  . Alcohol Use: No  . Drug Use: No  . Sexual Activity:    Partners: Male   Other Topics Concern  . None   Social History Narrative   ** Merged History Encounter **   Lives with son in a one story home.     Works in a Engineer, maintenance.    Education: high school      Additional Social History: Seperated white female. Has 6 years old son. Works full time  Musculoskeletal: Strength & Muscle Tone: within normal limits Gait & Station: normal Patient leans: no lean  Psychiatric Specialty Exam: HPI  Review of Systems  Constitutional: Negative for fever.  Respiratory: Negative for cough.   Cardiovascular: Negative for chest pain and palpitations.  Gastrointestinal: Negative for nausea.  Skin: Negative for itching.  Neurological: Negative for tremors and headaches.  Psychiatric/Behavioral: Negative for depression and hallucinations. The patient is not nervous/anxious.     Blood pressure 126/70, pulse 78, height 5\' 6"  (1.676 m), weight 220 lb (99.791 kg), SpO2 97 %.Body mass index is 35.53 kg/(m^2).  General Appearance: Casual  Eye Contact:  Fair  Speech:  Normal Rate  Volume:  Normal  Mood:  euthymic  Affect:  Congruent  Thought Process:  Coherent  Orientation:  Full (Time, Place, and Person)  Thought Content:  Rumination  Suicidal Thoughts:  No  Homicidal Thoughts:  No  Memory:  Immediate;   Fair Recent;   Fair  Judgement:  Fair  Insight:  Shallow  Psychomotor Activity:  Normal  Concentration:  Fair  Recall:  [place]: Fair  Akathisia:  Negative  Handed:  Right  AIMS (if indicated):    Assets:  Communication Skills Desire for Improvement Financial Resources/Insurance  ADL's:  Intact  Cognition:  WNL  Sleep:  Below average with frequent awakenings   Is the patient at risk to self?  No.  Allergies:   Allergies  Allergen Reactions  . Celebrex [Celecoxib] Itching  . Codeine     REACTION: hives  . Morphine And Related   . Celebrex [Celecoxib] Rash   Current Medications: Current Outpatient Prescriptions  Medication Sig Dispense Refill  . ALPRAZolam (XANAX) 0.5 MG tablet Take 1 tablet (0.5 mg total) by mouth daily. 30 tablet 1  . azithromycin (ZITHROMAX) 250 MG tablet 2 pills on day 1, 1 pill on day 2-5 6 tablet 0  . cyclobenzaprine (FLEXERIL) 10 MG tablet Take 1 tablet (10 mg total) by mouth 3 (three) times daily as needed for muscle spasms. 90 tablet 1  . diclofenac (VOLTAREN) 75 MG EC tablet Take 1 tablet (75 mg total) by mouth 2 (two) times daily. 60 tablet 2  . FLUoxetine (PROZAC) 10 MG tablet TAKE  1 TABLET (10 MG TOTAL) BY MOUTH DAILY. 30 tablet 0  . gabapentin (NEURONTIN) 600 MG tablet TAKE ONE TABLET BY MOUTH THREE TIMES DAILY 90 tablet 0  . Linaclotide (LINZESS) 145 MCG CAPS capsule Take by mouth.    . traMADol (ULTRAM) 50 MG tablet Take 1 tablet (50 mg total) by mouth every 8 (eight) hours as needed. 60 tablet 3  . venlafaxine XR (EFFEXOR-XR) 75 MG 24 hr capsule Take 1 capsule (75 mg total) by mouth daily with breakfast. 30 capsule 1  . estrogens, conjugated, (PREMARIN) 0.625 MG tablet Take 0.625 mg by mouth.    . [DISCONTINUED] traZODone (DESYREL) 50 MG tablet Take one at night 30 tablet 1   No current facility-administered medications for this visit.    Previous Psychotropic Medications: Yes  SSRI name not known. Had difficult time getting off from effexor.   Substance Abuse History in the last 12 months:  No.  Consequences of Substance Abuse: Negative or NA  Medical Decision Making:  Review of Psycho-Social Stressors (1), Established Problem, Worsening (2), Review of Medication Regimen & Side Effects (2) and Review of New Medication or Change in Dosage  (2)  Treatment Plan Summary: MDD continue effexor but at the current lower dose of 75mg  to avoid weight gain or itneraction with weight loss regimen Continue low dose prozac.  Panic disorder and  GAD. Continue xanax 0.5 mg since she is on it for long time. Expected to work on lowering dose later. Continue prozac as well for anxiety.  Avoid regular use  Insomnia; probably related to sleep apnea. Stop trazadone. Sleep is improving. She takes prn xanax. Also discussed sleep hygiene, cut down caffeine  call 911 or ED visit for any urgent need or suicidal tougths. Follow up in 8  weeks        Javante Nilsson 12/1/201611:06 AM

## 2015-08-17 ENCOUNTER — Other Ambulatory Visit (HOSPITAL_COMMUNITY): Payer: Self-pay | Admitting: Psychiatry

## 2015-08-18 NOTE — Telephone Encounter (Signed)
Received medication request from [facility] for Prozac 10mg . Dr. De Nurse, pt is authorized for a refill for Prozac 10mg , #30. Prescription was sent to pharmacy. Pt has a f/u appt on 10/13/15. Called and informed pt of prescription status. Pt verbalizes understanding.

## 2015-08-19 ENCOUNTER — Ambulatory Visit (INDEPENDENT_AMBULATORY_CARE_PROVIDER_SITE_OTHER): Payer: Managed Care, Other (non HMO) | Admitting: Family Medicine

## 2015-08-19 ENCOUNTER — Encounter: Payer: Self-pay | Admitting: Family Medicine

## 2015-08-19 VITALS — BP 122/83 | HR 74 | Temp 98.2°F | Ht 66.0 in | Wt 224.0 lb

## 2015-08-19 DIAGNOSIS — J209 Acute bronchitis, unspecified: Secondary | ICD-10-CM

## 2015-08-19 MED ORDER — FLUCONAZOLE 150 MG PO TABS: 150.0000 mg | ORAL_TABLET | Freq: Once | ORAL | Status: DC

## 2015-08-19 MED ORDER — AZITHROMYCIN 250 MG PO TABS: ORAL_TABLET | ORAL | Status: DC

## 2015-08-19 MED ORDER — HYDROCODONE-HOMATROPINE 5-1.5 MG/5ML PO SYRP: 5.0000 mL | ORAL_SOLUTION | Freq: Three times a day (TID) | ORAL | Status: DC | PRN

## 2015-08-19 NOTE — Progress Notes (Signed)
   Subjective:    Patient ID: Mary Cox, female    DOB: 1981-01-03, 34 y.o.   MRN: [id number]  HPI 34 year old female with congestion drainage cough symptoms of been present for 1 day a family member has a sinus infection. She has not had any fever. She cannot tell me that her mucus has tolerated.    Review of Systems  Constitutional: Negative.   HENT: Positive for congestion and postnasal drip.   Respiratory: Positive for cough.   Cardiovascular: Negative.   Neurological: Negative.       BP 122/83 mmHg  Pulse 74  Temp(Src) 98.2 F (36.8 C) (Oral)  Ht 5\' 6"  (1.676 m)  Wt 224 lb (101.606 kg)  BMI 36.17 kg/m2  Objective:   Physical Exam  Constitutional: She is oriented to person, place, and time. She appears well-developed and well-nourished.  Cardiovascular: Normal rate and regular rhythm.   Pulmonary/Chest: Effort normal. She has wheezes.  Neurological: She is alert and oriented to person, place, and time.          Assessment & Plan:  1. Acute bronchitis, unspecified organism Will treat with a Z-Pak. Take Mucinex in the daytime. Take Hycodan at bedtime to suppress cough. Reck 20 fluid  Wardell Honour MD

## 2015-08-19 NOTE — Addendum Note (Signed)
Addended by: Marylin Crosby on: 08/19/2015 01:35 PM   Modules accepted: Orders

## 2015-08-19 NOTE — Addendum Note (Signed)
Addended by: Marylin Crosby on: 08/19/2015 11:26 AM   Modules accepted: Orders

## 2015-08-25 ENCOUNTER — Telehealth: Payer: Self-pay | Admitting: Family Medicine

## 2015-08-25 NOTE — Telephone Encounter (Signed)
We can try amoxicillin 875 twice a day for 1 week

## 2015-08-26 MED ORDER — AMOXICILLIN 875 MG PO TABS: 875.0000 mg | ORAL_TABLET | Freq: Two times a day (BID) | ORAL | Status: DC

## 2015-09-02 ENCOUNTER — Other Ambulatory Visit (HOSPITAL_COMMUNITY): Payer: Self-pay | Admitting: Psychiatry

## 2015-09-07 NOTE — Telephone Encounter (Signed)
Received medication request from [facility] for Trazodone 50mg . Per Dr. De Nurse, pt is authorized for a refill Trazodone 50mg , #30. Rx was sent to pharmacy. Pt is schedule for a f/u appt on 10/13/15. Called and informed pt of rx status. Pt verbalizes understanding.

## 2015-09-14 ENCOUNTER — Other Ambulatory Visit (HOSPITAL_COMMUNITY): Payer: Self-pay | Admitting: Psychiatry

## 2015-09-17 NOTE — Telephone Encounter (Signed)
Received medication request [facility] for Prozac 10mg . Per Dr. De Nurse, pt is authorized for a refill for Prozac 10mg , #30. Prescription was sent to pharmacy. Called and informed pt of prescription status. Pt verbalizes understanding. Pt is schedule a f/u appt on 10/13/15.

## 2015-09-24 ENCOUNTER — Other Ambulatory Visit (HOSPITAL_COMMUNITY): Payer: Self-pay | Admitting: Psychiatry

## 2015-09-28 NOTE — Telephone Encounter (Signed)
PT will need a prescription written for Xanax 0.5mg . Prescription was last filled on 08/30/15. Please call once prescription is ready. Pt is schedule for a f/u appt on 10/13/15.

## 2015-09-28 NOTE — Telephone Encounter (Signed)
Xanax printed for pick up

## 2015-09-29 ENCOUNTER — Ambulatory Visit (HOSPITAL_COMMUNITY): Payer: Self-pay | Admitting: Psychiatry

## 2015-10-07 ENCOUNTER — Other Ambulatory Visit (HOSPITAL_COMMUNITY): Payer: Self-pay | Admitting: Psychiatry

## 2015-10-10 ENCOUNTER — Other Ambulatory Visit: Payer: Self-pay | Admitting: Family Medicine

## 2015-10-13 ENCOUNTER — Ambulatory Visit (HOSPITAL_COMMUNITY): Payer: 59 | Admitting: Psychiatry

## 2015-10-13 ENCOUNTER — Encounter (HOSPITAL_COMMUNITY): Payer: Self-pay | Admitting: Psychiatry

## 2015-10-13 ENCOUNTER — Ambulatory Visit (INDEPENDENT_AMBULATORY_CARE_PROVIDER_SITE_OTHER): Payer: 59 | Admitting: Psychiatry

## 2015-10-13 VITALS — BP 124/70 | HR 69 | Ht 66.0 in | Wt 228.0 lb

## 2015-10-13 DIAGNOSIS — F410 Panic disorder [episodic paroxysmal anxiety] without agoraphobia: Secondary | ICD-10-CM

## 2015-10-13 DIAGNOSIS — G4700 Insomnia, unspecified: Secondary | ICD-10-CM | POA: Diagnosis not present

## 2015-10-13 DIAGNOSIS — F331 Major depressive disorder, recurrent, moderate: Secondary | ICD-10-CM

## 2015-10-13 DIAGNOSIS — F411 Generalized anxiety disorder: Secondary | ICD-10-CM

## 2015-10-13 MED ORDER — TRAZODONE HCL 100 MG PO TABS: 100.0000 mg | ORAL_TABLET | Freq: Every day | ORAL | Status: DC

## 2015-10-13 MED ORDER — FLUOXETINE HCL 20 MG PO TABS: 20.0000 mg | ORAL_TABLET | Freq: Every day | ORAL | Status: DC

## 2015-10-13 NOTE — Progress Notes (Signed)
Patient ID: Mary Cox, female   DOB: 02-09-1981, 35 y.o.   MRN: [id number]  [facility] Outpatient Follow up visit  Patient Identification: Mary Cox MRN:  [id number] Date of Evaluation:  10/13/2015 Referral Source: Primary care [facility] Chief Complaint:   Chief Complaint    Follow-up     Visit Diagnosis:    ICD-9-CM ICD-10-CM   1. Major depressive disorder, recurrent episode, moderate (HCC) 296.32 F33.1   2. Panic disorder 300.01 F41.0   3. GAD (generalized anxiety disorder) 300.02 F41.1   4. Insomnia 780.52 G47.00    Diagnosis:   Patient Active Problem List   Diagnosis Date Noted  . Major depressive disorder, recurrent episode, moderate ([place]) [F33.1] 02/25/2015  . Panic disorder [F41.0] 02/25/2015  . Pain in joint, shoulder region [M25.519] 10/30/2014  . Biliary calculus with cholecystitis [K80.10] 10/14/2014  . Adult BMI 30+ [E66.8] 09/25/2014  . Cold intolerance [R68.89] 12/05/2012  . Other malaise and fatigue [R53.81, R53.83] 12/05/2012  . GERD (gastroesophageal reflux disease) [K21.9] 12/05/2012  . Generalized anxiety disorder [F41.1] 12/05/2012  . Anxiety [F41.9]   . ANXIETY DISORDER [F41.1] 09/15/2010  . ESOPHAGEAL REFLUX [K21.9] 09/15/2010  . HYPERSOMNIA WITH SLEEP APNEA UNSPECIFIED [G47.10, G47.30] 09/15/2010  . PALPITATIONS [R00.2] 09/15/2010   History of Present Illness:  Patient is a 35 years old currently separated Caucasian female living by herself . Initially presented  for the following  "Endorses depression getting worse for the last 1 year. She has had symptoms of decreased energy decreased concentration, anhedonia withdrawn disturbed appetite sleep and feeling of hopelessness at times. She also endorsed excessive worries, disturbed sleep muscle tightening at night. Worries are excessive and unreasonable at times including some palpitations and panic attacks which has been recent. She has been evaluated by primary care and EKG and her workup was benign.  "  She is following up with her primary care physician Dr. Andreas Ohm and is in the process of starting injection while in B-12 and phentermine. For this reason she cut down the Effexor last visit to 75 mg. We introduced Prozac and dose of 10 mg to balance that out. She still has not started the injection says it costs. Does feel down, a motivated at times she sleeps a lot. Recently started work but she is not very eager and motivated about the work either. Panic attacks; less frequent Depression: fluctuates more sad days. GAD: worries about her son  Tolerating medications.  Insomnia: worse depending upon her daytime in bed. Takes xanax during day and trazadone at night. Cant sleep much at night.  Severity of depression: 6/10. 10 being happy. (worsened) Aggravating factors; split from her husband  In 2015  after 41 years of marriage. Job stress  Modifying factors; her son 22  years of age.  She does not endorse having any delusions or hallucinations. There is no paranoia or hallucinations when she is getting depressed.  She does not endorse using drugs or alcohol.    Psychotic Symptoms:  denies PTSD Symptoms: NA  Past Medical History:  Past Medical History  Diagnosis Date  . Anxiety     [facility], dr watts  . Epigastric pain   . Sleep apnea   . Chest pain   . Anxiety   . GERD (gastroesophageal reflux disease)   . Depression   . Cancer ([place])   . Ovarian cancer [facility])     Past Surgical History  Procedure Laterality Date  . Abdominal hysterectomy    . Appendectomy    .  Hernia repair     Family History:  Family History  Problem Relation Age of Onset  . Coronary artery disease Neg Hx   . Depression Maternal Aunt   . Anxiety disorder Maternal Aunt   . Anxiety disorder Maternal Grandmother    Social History:   Social History   Social History  . Marital Status: Married    Spouse Name: N/A  . Number of Children: N/A  . Years of Education: N/A   Social  History Main Topics  . Smoking status: Never Smoker   . Smokeless tobacco: None  . Alcohol Use: No  . Drug Use: No  . Sexual Activity:    Partners: Male   Other Topics Concern  . None   Social History Narrative   ** Merged History Encounter **   Lives with son in a one story home.     Works in a Engineer, maintenance.    Education: high school      Additional Social History: Seperated white female. Has 36 years old son. Works full time  Musculoskeletal: Strength & Muscle Tone: within normal limits Gait & Station: normal Patient leans: no lean  Psychiatric Specialty Exam: HPI  Review of Systems  Constitutional: Negative for fever.  Respiratory: Negative for cough.   Cardiovascular: Negative for chest pain and palpitations.  Gastrointestinal: Negative for nausea.  Skin: Negative for itching.  Neurological: Negative for tremors and headaches.  Psychiatric/Behavioral: Positive for depression. Negative for hallucinations. The patient has insomnia. The patient is not nervous/anxious.     Blood pressure 124/70, pulse 69, height 5\' 6"  (1.676 m), weight 228 lb (103.42 kg), SpO2 96 %.Body mass index is 36.82 kg/(m^2).  General Appearance: Casual  Eye Contact:  Fair  Speech:  Normal Rate  Volume:  Normal  Mood:  dysthymic  Affect:  Congruent  Thought Process:  Coherent  Orientation:  Full (Time, Place, and Person)  Thought Content:  Rumination  Suicidal Thoughts:  No  Homicidal Thoughts:  No  Memory:  Immediate;   Fair Recent;   Fair  Judgement:  Fair  Insight:  Shallow  Psychomotor Activity:  Normal  Concentration:  Fair  Recall:  [place]: Fair  Akathisia:  Negative  Handed:  Right  AIMS (if indicated):    Assets:  Communication Skills Desire for Improvement Financial Resources/Insurance  ADL's:  Intact  Cognition: WNL  Sleep:  Below average with frequent awakenings   Is the patient at risk to self?   No.  Allergies:   Allergies  Allergen Reactions  . Celebrex [Celecoxib] Itching  . Codeine     REACTION: hives  . Morphine And Related   . Celebrex [Celecoxib] Rash   Current Medications: Current Outpatient Prescriptions  Medication Sig Dispense Refill  . ALPRAZolam (XANAX) 0.5 MG tablet TAKE 1 TABLET BY MOUTH EVERY DAY 30 tablet 0  . amoxicillin (AMOXIL) 875 MG tablet Take 1 tablet (875 mg total) by mouth 2 (two) times daily. 1 po BID 14 tablet 0  . azithromycin (ZITHROMAX Z-PAK) 250 MG tablet Take as directed. 6 each 0  . estrogens, conjugated, (PREMARIN) 0.625 MG tablet Take 0.625 mg by mouth.    . fluconazole (DIFLUCAN) 150 MG tablet Take 1 tablet (150 mg total) by mouth once. 1 tablet 0  . FLUoxetine (PROZAC) 20 MG tablet Take 1 tablet (20 mg total) by mouth daily. 30 tablet 0  . gabapentin (NEURONTIN) 600 MG tablet TAKE ONE TABLET  BY MOUTH THREE TIMES DAILY 90 tablet 1  . HYDROcodone-homatropine (HYCODAN) 5-1.5 MG/5ML syrup Take 5 mLs by mouth every 8 (eight) hours as needed for cough. 120 mL 0  . Linaclotide (LINZESS) 145 MCG CAPS capsule Take by mouth.    . traZODone (DESYREL) 100 MG tablet Take 1 tablet (100 mg total) by mouth at bedtime. 30 tablet 0  . venlafaxine XR (EFFEXOR-XR) 75 MG 24 hr capsule Take 1 capsule (75 mg total) by mouth daily with breakfast. 30 capsule 1   No current facility-administered medications for this visit.    Previous Psychotropic Medications: Yes  SSRI name not known. Had difficult time getting off from effexor.   Substance Abuse History in the last 12 months:  No.  Consequences of Substance Abuse: Negative or NA    Treatment Plan Summary: MDD continue effexor but at the current lower dose of 75mg  to avoid weight gain or itneraction with weight loss regimen Increase prozac 20mg .  Panic disorder and  GAD. Continue xanax 0.5 mg since she is on it for long time. Expected to work on lowering dose later. Continue prozac as well for anxiety.   Avoid regular use of xanax  Insomnia; probably related to sleep apnea.increase trazadone 100mg . Also discussed sleep hygiene, cut down caffeine  call 911 or ED visit for any urgent need or suicidal tougths. Follow up in 4  weeks        Rhonin Trott 2/15/201711:02 AM

## 2015-10-14 ENCOUNTER — Ambulatory Visit: Payer: Managed Care, Other (non HMO) | Admitting: Family Medicine

## 2015-10-15 ENCOUNTER — Encounter: Payer: Self-pay | Admitting: *Deleted

## 2015-10-15 ENCOUNTER — Encounter: Payer: Self-pay | Admitting: Family Medicine

## 2015-10-15 ENCOUNTER — Ambulatory Visit (INDEPENDENT_AMBULATORY_CARE_PROVIDER_SITE_OTHER): Payer: Managed Care, Other (non HMO) | Admitting: Family Medicine

## 2015-10-15 VITALS — BP 120/77 | HR 82 | Temp 98.2°F | Ht 66.0 in | Wt 229.6 lb

## 2015-10-15 DIAGNOSIS — Z0000 Encounter for general adult medical examination without abnormal findings: Secondary | ICD-10-CM

## 2015-10-15 NOTE — Progress Notes (Signed)
BP 120/77 mmHg  Pulse 82  Temp(Src) 98.2 F (36.8 C) (Oral)  Ht _0  (1.676 m)  Wt 229 lb 9.6 oz (104.146 kg)  BMI 37.08 kg/m2   Subjective:    Patient ID: Mary Cox, female    DOB: May 28, 1981, 35 y.o.   MRN: [id number]  HPI: [name] is a 35 y.o. female presenting on 10/15/2015 for Eye doctor recommended she have labwork because vision has c   HPI Adult well exam Patient is coming in today for well exam and screening labs. She has been concerned about weight gain recently and then also went to an ophthalmologist who did an exam and thought that she needed to be tested for diabetes again. She has been having weight gain consistently over the past 4 years since she had a total abdominal hysterectomy for ovarian cancer. She is on hormone replacement therapy and antidepressant medications. He denies any chest pain, shortness of breath, headaches or vision issues, abdominal complaints, diarrhea, nausea, vomiting, or joint issues. She has gained somewhere between 50 and 60 pounds over the past 4 years.  Relevant past medical, surgical, family and social history reviewed and updated as indicated. Interim medical history since our last visit reviewed. Allergies and medications reviewed and updated.  Review of Systems  Constitutional: Positive for unexpected weight change. Negative for fever and chills.  HENT: Negative for congestion, ear discharge and ear pain.   Eyes: Negative for redness and visual disturbance.  Respiratory: Negative for chest tightness and shortness of breath.   Cardiovascular: Negative for chest pain and leg swelling.  Gastrointestinal: Negative for nausea, abdominal pain, diarrhea and constipation.  Genitourinary: Negative for dysuria and difficulty urinating.  Musculoskeletal: Negative for back pain and gait problem.  Skin: Negative for rash.  Neurological: Negative for dizziness, light-headedness and headaches.  Psychiatric/Behavioral: Negative for  behavioral problems and agitation.  All other systems reviewed and are negative.   Per HPI unless specifically indicated above     Medication List       This list is accurate as of: 10/15/15  9:50 AM.  Always use your most recent med list.               ALPRAZolam 0.5 MG tablet  Commonly known as:  XANAX  TAKE 1 TABLET BY MOUTH EVERY DAY     estrogens (conjugated) 0.625 MG tablet  Commonly known as:  PREMARIN  Take 0.625 mg by mouth.     FLUoxetine 20 MG tablet  Commonly known as:  PROZAC  Take 1 tablet (20 mg total) by mouth daily.     gabapentin 600 MG tablet  Commonly known as:  NEURONTIN  TAKE ONE TABLET BY MOUTH THREE TIMES DAILY     Linaclotide 145 MCG Caps capsule  Commonly known as:  LINZESS  Take by mouth.     traZODone 100 MG tablet  Commonly known as:  DESYREL  Take 1 tablet (100 mg total) by mouth at bedtime.     venlafaxine XR 75 MG 24 hr capsule  Commonly known as:  EFFEXOR-XR  Take 1 capsule (75 mg total) by mouth daily with breakfast.           Objective:    BP 120/77 mmHg  Pulse 82  Temp(Src) 98.2 F (36.8 C) (Oral)  Ht _1  (1.676 m)  Wt 229 lb 9.6 oz (104.146 kg)  BMI 37.08 kg/m2  Wt Readings from Last 3 Encounters:  10/15/15 229 lb 9.6 oz (  104.146 kg)  10/13/15 228 lb (103.42 kg)  08/19/15 224 lb (101.606 kg)    Physical Exam  Constitutional: She is oriented to person, place, and time. She appears well-developed and well-nourished. No distress.  Eyes: Conjunctivae and EOM are normal. Pupils are equal, round, and reactive to light.  Neck: Neck supple. No tracheal deviation present. No thyromegaly present.  Cardiovascular: Normal rate, regular rhythm, normal heart sounds and intact distal pulses.   No murmur heard. Pulmonary/Chest: Effort normal and breath sounds normal. No respiratory distress. She has no wheezes.  Abdominal: Soft. Bowel sounds are normal. She exhibits no distension. There is no tenderness. There is no rebound.    Musculoskeletal: Normal range of motion. She exhibits no edema or tenderness.  Lymphadenopathy:    She has no cervical adenopathy.  Neurological: She is alert and oriented to person, place, and time. Coordination normal.  Skin: Skin is warm and dry. No rash noted. She is not diaphoretic.  Psychiatric: She has a normal mood and affect. Her behavior is normal.  Nursing note and vitals reviewed.     Assessment & Plan:       Problem List Items Addressed This Visit    None    Visit Diagnoses    Well adult exam    -  Primary    Morbid obesity, unspecified obesity type ([place])        Relevant Orders    CMP14+EGFR    Lipid panel    TSH    Vitamin B12    VITAMIN D 25 Hydroxy (Vit-D Deficiency, Fractures)        Follow up plan: Return in about 1 year (around 10/14/2016), or if symptoms worsen or fail to improve.  Counseling provided for all of the vaccine components Orders Placed This Encounter  Procedures  . CMP14+EGFR  . Lipid panel  . TSH  . Vitamin B12  . VITAMIN D 25 Hydroxy (Vit-D Deficiency, Fractures)    Caryl Pina, MD [facility] 10/15/2015, 9:50 AM

## 2015-10-15 NOTE — Telephone Encounter (Signed)
Received medication from [facility] for Venlafaxine 75mg . Per Dr. De Nurse, medication refill is authorized for Venlafaxine 75mg , #30. Prescription was sent to pharmacy. Pt is schedule f/u appt on 11/11/15. Called and informed pt of prescription status. Pt verbalizes understanding.

## 2015-10-16 LAB — CMP14+EGFR
ALBUMIN: 3.9 g/dL (ref 3.5–5.5)
ALK PHOS: 120 IU/L — AB (ref 39–117)
ALT: 12 IU/L (ref 0–32)
AST: 16 IU/L (ref 0–40)
Albumin/Globulin Ratio: 1.5 (ref 1.1–2.5)
BILIRUBIN TOTAL: 0.2 mg/dL (ref 0.0–1.2)
BUN / CREAT RATIO: 10 (ref 8–20)
BUN: 8 mg/dL (ref 6–20)
CHLORIDE: 102 mmol/L (ref 96–106)
CO2: 22 mmol/L (ref 18–29)
CREATININE: 0.84 mg/dL (ref 0.57–1.00)
Calcium: 8.9 mg/dL (ref 8.7–10.2)
GFR calc non Af Amer: 90 mL/min/{1.73_m2} (ref >59)
GFR, EST AFRICAN AMERICAN: 104 mL/min/{1.73_m2} (ref >59)
GLOBULIN, TOTAL: 2.6 g/dL (ref 1.5–4.5)
GLUCOSE: 86 mg/dL (ref 65–99)
Potassium: 4.1 mmol/L (ref 3.5–5.2)
SODIUM: 143 mmol/L (ref 134–144)
TOTAL PROTEIN: 6.5 g/dL (ref 6.0–8.5)

## 2015-10-16 LAB — LIPID PANEL
CHOLESTEROL TOTAL: 163 mg/dL (ref 100–199)
Chol/HDL Ratio: 3.8 ratio units (ref 0.0–4.4)
HDL: 43 mg/dL (ref >39)
LDL CALC: 93 mg/dL (ref 0–99)
Triglycerides: 137 mg/dL (ref 0–149)
VLDL CHOLESTEROL CAL: 27 mg/dL (ref 5–40)

## 2015-10-16 LAB — TSH: TSH: 1.31 u[IU]/mL (ref 0.450–4.500)

## 2015-10-16 LAB — VITAMIN B12: VITAMIN B 12: 294 pg/mL (ref 211–946)

## 2015-10-16 LAB — VITAMIN D 25 HYDROXY (VIT D DEFICIENCY, FRACTURES): VIT D 25 HYDROXY: 18.4 ng/mL — AB (ref 30.0–100.0)

## 2015-10-18 ENCOUNTER — Telehealth: Payer: Self-pay | Admitting: Family Medicine

## 2015-10-18 NOTE — Telephone Encounter (Signed)
Recent visit with Dettinger = please address

## 2015-10-20 MED ORDER — LIRAGLUTIDE -WEIGHT MANAGEMENT 18 MG/3ML ~~LOC~~ SOPN: 3.0000 mg | PEN_INJECTOR | Freq: Every day | SUBCUTANEOUS | Status: DC

## 2015-10-20 NOTE — Telephone Encounter (Signed)
If patient is willing to do an injectable we can try for saxenda, 0.6 mg once daily for one week; increase by 0.6 mg daily at weekly intervals to a target dose of 3 mg once daily.  She will have to do an injection every day. This is probably one of the safest forms of weight loss. If she does want to do this then we can send it for her but I want her to follow-up in one month to evaluate progress. Medication can cause upset stomach initially but that should taper off. If it becomes too significant and we will drop the dose down a little bit lower dose. Caryl Pina, MD [facility] 10/20/2015, 8:13 AM

## 2015-10-20 NOTE — Telephone Encounter (Signed)
Advised pt of MD feedback and she would like to try it. Pt will CB to schedule appt with Dr.Dettinger in one month to evaluate progress. Rx sent to pharmacy.

## 2015-11-02 ENCOUNTER — Ambulatory Visit (INDEPENDENT_AMBULATORY_CARE_PROVIDER_SITE_OTHER): Payer: Managed Care, Other (non HMO) | Admitting: Family Medicine

## 2015-11-02 ENCOUNTER — Ambulatory Visit (INDEPENDENT_AMBULATORY_CARE_PROVIDER_SITE_OTHER): Payer: Managed Care, Other (non HMO)

## 2015-11-02 VITALS — BP 113/57 | HR 69 | Temp 97.0°F | Ht 66.0 in | Wt 232.8 lb

## 2015-11-02 DIAGNOSIS — M25511 Pain in right shoulder: Secondary | ICD-10-CM

## 2015-11-02 MED ORDER — BETAMETHASONE SOD PHOS & ACET 6 (3-3) MG/ML IJ SUSP
6.0000 mg | Freq: Once | INTRAMUSCULAR | Status: AC
  Administered 2015-11-02: 6 mg via INTRAMUSCULAR

## 2015-11-02 MED ORDER — DICLOFENAC SODIUM 75 MG PO TBEC: 75.0000 mg | DELAYED_RELEASE_TABLET | Freq: Two times a day (BID) | ORAL | Status: DC

## 2015-11-02 MED ORDER — CYCLOBENZAPRINE HCL 10 MG PO TABS: 10.0000 mg | ORAL_TABLET | Freq: Three times a day (TID) | ORAL | Status: DC | PRN

## 2015-11-02 NOTE — Progress Notes (Signed)
Subjective:  Patient ID: Mary Cox, female    DOB: March 07, 1981  Age: 35 y.o. MRN: [id number]  CC: Shoulder Pain   HPI TRINTY ALVILLAR presents for chronic arthritis pain that has been exacerbated by acute pain in the right shoulder starting today. This has been brought on by repetitive use at work. She just started a new job. She states that her pain is a dull ache in the posterior superior aspect of the right shoulder. It hurts to flex the arm and elevate the shoulder as well as abduct the shoulder. Onset today. Pain is 8/10. Tightness as well as the dull ache.   History Jerilynn has a past medical history of Anxiety; Epigastric pain; Sleep apnea; Chest pain; Anxiety; GERD (gastroesophageal reflux disease); Depression; Cancer ([place]); and Ovarian cancer ([place]).   She has past surgical history that includes Abdominal hysterectomy; Appendectomy; and Hernia repair.   Her family history includes Anxiety disorder in her maternal aunt and maternal grandmother; Depression in her maternal aunt. There is no history of Coronary artery disease.She reports that she has never smoked. She does not have any smokeless tobacco history on file. She reports that she does not drink alcohol or use illicit drugs.    ROS Review of Systems  Constitutional: Negative for fever, activity change and appetite change.  HENT: Negative for congestion, rhinorrhea and sore throat.   Eyes: Negative for visual disturbance.  Respiratory: Negative for cough and shortness of breath.   Cardiovascular: Negative for chest pain and palpitations.  Gastrointestinal: Negative for nausea, abdominal pain and diarrhea.  Genitourinary: Negative for dysuria.  Musculoskeletal: Negative for myalgias and arthralgias.    Objective:  BP 113/57 mmHg  Pulse 69  Temp(Src) 97 F (36.1 C) (Oral)  Ht 5\' 6"  (1.676 m)  Wt 232 lb 12.8 oz (105.597 kg)  BMI 37.59 kg/m2  BP Readings from Last 3 Encounters:  11/02/15 113/57  10/15/15 120/77    10/13/15 124/70    Wt Readings from Last 3 Encounters:  11/02/15 232 lb 12.8 oz (105.597 kg)  10/15/15 229 lb 9.6 oz (104.146 kg)  10/13/15 228 lb (103.42 kg)     Physical Exam  Constitutional: She appears well-developed and well-nourished.  HENT:  Head: Normocephalic.  Cardiovascular: Normal rate and regular rhythm.   No murmur heard. Pulmonary/Chest: Effort normal and breath sounds normal.  Musculoskeletal: Normal range of motion. She exhibits tenderness. She exhibits no edema.  Tenderness is noted at the right superior trapezius. There is also pain for resisted abduction. The right upper extremity appears neurovascularly intact for sensation and strength as well as pulses.     Lab Results  Component Value Date   WBC 7.9 04/07/2014   HGB 12.5 04/07/2014   HCT 38.7 04/07/2014   PLT 286 05/30/2010   GLUCOSE 86 10/15/2015   CHOL 163 10/15/2015   TRIG 137 10/15/2015   HDL 43 10/15/2015   LDLCALC 93 10/15/2015   ALT 12 10/15/2015   AST 16 10/15/2015   NA 143 10/15/2015   K 4.1 10/15/2015   CL 102 10/15/2015   CREATININE 0.84 10/15/2015   BUN 8 10/15/2015   CO2 22 10/15/2015   TSH 1.310 10/15/2015    No results found.  Assessment & Plan:   Torin was seen today for shoulder pain.  Diagnoses and all orders for this visit:  Pain in joint of right shoulder -     DG Shoulder Right; Future -     betamethasone acetate-betamethasone sodium phosphate (  CELESTONE) injection 6 mg; Inject 1 mL (6 mg total) into the muscle once. -     Ambulatory referral to Physical Therapy  Pain in joint, shoulder region, right  Other orders -     cyclobenzaprine (FLEXERIL) 10 MG tablet; Take 1 tablet (10 mg total) by mouth 3 (three) times daily as needed for muscle spasms. -     diclofenac (VOLTAREN) 75 MG EC tablet; Take 1 tablet (75 mg total) by mouth 2 (two) times daily.    Right shoulder x-ray shows no acute abnormality.  I have discontinued Ms. Stocking's Linaclotide and  Liraglutide -Weight Management. I am also having her start on cyclobenzaprine and diclofenac. Additionally, I am having her maintain her estrogens (conjugated), ALPRAZolam, venlafaxine XR, gabapentin, traZODone, FLUoxetine, and lubiprostone. We will continue to administer betamethasone acetate-betamethasone sodium phosphate.  Meds ordered this encounter  Medications  . lubiprostone (AMITIZA) 24 MCG capsule    Sig: Take 24 mcg by mouth 2 (two) times daily with a meal.  . cyclobenzaprine (FLEXERIL) 10 MG tablet    Sig: Take 1 tablet (10 mg total) by mouth 3 (three) times daily as needed for muscle spasms.    Dispense:  90 tablet    Refill:  1  . diclofenac (VOLTAREN) 75 MG EC tablet    Sig: Take 1 tablet (75 mg total) by mouth 2 (two) times daily.    Dispense:  60 tablet    Refill:  2  . betamethasone acetate-betamethasone sodium phosphate (CELESTONE) injection 6 mg    Sig:      Follow-up: No Follow-up on file.  Claretta Fraise, M.D.

## 2015-11-04 ENCOUNTER — Other Ambulatory Visit (HOSPITAL_COMMUNITY): Payer: Self-pay | Admitting: Psychiatry

## 2015-11-05 NOTE — Telephone Encounter (Signed)
Patient aware of labs on 2/20

## 2015-11-08 NOTE — Telephone Encounter (Signed)
Received medication request from [facility] for Effexor 75mg . Per Dr. De Nurse, pt is authorized for a refill for Effexor 75mg , #30. Prescription was sent to pharmacy. Pt is schedule for a f/u appt on 11/11/15. Called and informed pt of prescription status. Pt verbalizes understanding.

## 2015-11-09 ENCOUNTER — Telehealth: Payer: Self-pay | Admitting: Family Medicine

## 2015-11-09 MED ORDER — PREDNISONE 10 MG PO TABS: ORAL_TABLET | ORAL | Status: DC

## 2015-11-09 NOTE — Telephone Encounter (Signed)
The requested med has been sent to the pharmacy.  Please let the patient know. Thanks, WS 

## 2015-11-10 ENCOUNTER — Encounter: Payer: Self-pay | Admitting: Family Medicine

## 2015-11-10 ENCOUNTER — Ambulatory Visit (INDEPENDENT_AMBULATORY_CARE_PROVIDER_SITE_OTHER): Payer: Managed Care, Other (non HMO) | Admitting: Family Medicine

## 2015-11-10 ENCOUNTER — Encounter (HOSPITAL_COMMUNITY): Payer: Self-pay | Admitting: Psychiatry

## 2015-11-10 ENCOUNTER — Ambulatory Visit (INDEPENDENT_AMBULATORY_CARE_PROVIDER_SITE_OTHER): Payer: 59 | Admitting: Psychiatry

## 2015-11-10 VITALS — BP 128/72 | HR 70 | Ht 66.0 in | Wt 232.0 lb

## 2015-11-10 VITALS — BP 120/71 | HR 61 | Temp 97.7°F | Ht 66.0 in | Wt 232.0 lb

## 2015-11-10 DIAGNOSIS — F410 Panic disorder [episodic paroxysmal anxiety] without agoraphobia: Secondary | ICD-10-CM

## 2015-11-10 DIAGNOSIS — G4700 Insomnia, unspecified: Secondary | ICD-10-CM | POA: Diagnosis not present

## 2015-11-10 DIAGNOSIS — F411 Generalized anxiety disorder: Secondary | ICD-10-CM

## 2015-11-10 DIAGNOSIS — F331 Major depressive disorder, recurrent, moderate: Secondary | ICD-10-CM

## 2015-11-10 DIAGNOSIS — S86812A Strain of other muscle(s) and tendon(s) at lower leg level, left leg, initial encounter: Secondary | ICD-10-CM | POA: Diagnosis not present

## 2015-11-10 MED ORDER — LIRAGLUTIDE 18 MG/3ML ~~LOC~~ SOPN: 18.0000 mg | PEN_INJECTOR | Freq: Every day | SUBCUTANEOUS | Status: DC

## 2015-11-10 MED ORDER — FLUOXETINE HCL 20 MG PO TABS: 20.0000 mg | ORAL_TABLET | Freq: Every day | ORAL | Status: DC

## 2015-11-10 MED ORDER — ALPRAZOLAM 0.5 MG PO TABS: 0.5000 mg | ORAL_TABLET | Freq: Every day | ORAL | Status: DC

## 2015-11-10 MED ORDER — TRAZODONE HCL 100 MG PO TABS: 100.0000 mg | ORAL_TABLET | Freq: Every day | ORAL | Status: DC

## 2015-11-10 NOTE — Telephone Encounter (Signed)
Patient aware.

## 2015-11-10 NOTE — Progress Notes (Signed)
Patient ID: Mary Cox, female   DOB: [birth year], 35 y.o.   MRN: [id number]  [facility] Outpatient Follow up visit  Patient Identification: Mary Cox MRN:  [id number] Date of Evaluation:  11/10/2015 Referral Source: Primary care [facility] Chief Complaint:   Chief Complaint    Follow-up     Visit Diagnosis:    ICD-9-CM ICD-10-CM   1. Major depressive disorder, recurrent episode, moderate (HCC) 296.32 F33.1   2. Panic disorder 300.01 F41.0   3. GAD (generalized anxiety disorder) 300.02 F41.1   4. Insomnia 780.52 G47.00    Diagnosis:   Patient Active Problem List   Diagnosis Date Noted  . Pain in joint of right shoulder [M25.511] 11/02/2015  . Major depressive disorder, recurrent episode, moderate ([place]) [F33.1] 02/25/2015  . Panic disorder [F41.0] 02/25/2015  . Biliary calculus with cholecystitis [K80.10] 10/14/2014  . Adult BMI 30+ [E66.8] 09/25/2014  . GERD (gastroesophageal reflux disease) [K21.9] 12/05/2012  . Generalized anxiety disorder [F41.1] 12/05/2012  . HYPERSOMNIA WITH SLEEP APNEA UNSPECIFIED [G47.10, G47.30] 09/15/2010   History of Present Illness:  Patient is a 35 years old currently separated Caucasian female Cox by herself . Initially presented  With depression, anxiety. Was seening Dr. Andreas Ohm and waiting for injection B-12 and phenteramine  of weight loss for which she wanted to cut down effexor.  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 GAD: worries about her son  Tolerating medications.   Severity of depression: 6/10. 10 being happy.  Aggravating factors; split from her husband  In 2015  after 17 years of  marriage. Job stress  Modifying factors; her son 68  years of age.  She does not endorse having any delusions or hallucinations. There is no paranoia or hallucinations when she is getting depressed.  She does not endorse using drugs or alcohol.    Psychotic Symptoms:  denies PTSD Symptoms: NA  Past Medical History:  Past Medical History  Diagnosis Date  . Anxiety     [facility], dr watts  . Epigastric pain   . Sleep apnea   . Chest pain   . Anxiety   . GERD (gastroesophageal reflux disease)   . Depression   . Cancer ([place])   . Ovarian cancer [facility])     Past Surgical History  Procedure Laterality Date  . Abdominal hysterectomy    . Appendectomy    . Hernia repair     Family History:  Family History  Problem Relation Age of Onset  . Coronary artery disease Neg Hx   . Depression Maternal Aunt   . Anxiety disorder Maternal Aunt   . Anxiety disorder Maternal Grandmother      Musculoskeletal: Strength & Muscle Tone: within normal limits Gait & Station: normal Patient leans: no lean  Psychiatric Specialty Exam: HPI  Review of Systems  Constitutional: Negative for fever.  Respiratory: Negative for cough.   Cardiovascular: Negative for chest pain and palpitations.  Gastrointestinal: Negative for nausea.  Skin: Negative for itching.  Neurological: Negative for tingling, tremors and headaches.  Psychiatric/Behavioral: Negative for hallucinations. The patient has insomnia. The patient is not nervous/anxious.     Blood pressure 128/72,  pulse 70, height 5\' 6"  (1.676 m), weight 232 lb (105.235 kg), SpO2 97 %.Body mass index is 37.46 kg/(m^2).  General Appearance: Casual  Eye Contact:  Fair  Speech:  Normal Rate  Volume:  Normal  Mood:  Less dysphoric  Affect:  Congruent  Thought Process:  Coherent  Orientation:  Full (Time, Place, and Person)  Thought Content:  Rumination  Suicidal Thoughts:  No  Homicidal Thoughts:  No  Memory:  Immediate;    Fair Recent;   Fair  Judgement:  Fair  Insight:  Shallow  Psychomotor Activity:  Normal  Concentration:  Fair  Recall:  [place]: Fair  Akathisia:  Negative  Handed:  Right  AIMS (if indicated):    Assets:  Communication Skills Desire for Improvement Financial Resources/Insurance  ADL's:  Intact  Cognition: WNL  Sleep:  Below average with frequent awakenings   Is the patient at risk to self?  No.  Allergies:   Allergies  Allergen Reactions  . Celebrex [Celecoxib] Itching  . Codeine     REACTION: hives  . Morphine And Related   . Celebrex [Celecoxib] Rash   Current Medications: Current Outpatient Prescriptions  Medication Sig Dispense Refill  . ALPRAZolam (XANAX) 0.5 MG tablet Take 1 tablet (0.5 mg total) by mouth daily. 30 tablet 0  . cyclobenzaprine (FLEXERIL) 10 MG tablet Take 1 tablet (10 mg total) by mouth 3 (three) times daily as needed for muscle spasms. 90 tablet 1  . diclofenac (VOLTAREN) 75 MG EC tablet Take 1 tablet (75 mg total) by mouth 2 (two) times daily. 60 tablet 2  . estrogens, conjugated, (PREMARIN) 0.625 MG tablet Take 0.625 mg by mouth.    Marland Kitchen FLUoxetine (PROZAC) 20 MG tablet Take 1 tablet (20 mg total) by mouth daily. 30 tablet 1  . gabapentin (NEURONTIN) 600 MG tablet TAKE ONE TABLET BY MOUTH THREE TIMES DAILY 90 tablet 1  . lubiprostone (AMITIZA) 24 MCG capsule Take 24 mcg by mouth 2 (two) times daily with a meal.    . predniSONE (DELTASONE) 10 MG tablet Take 5 daily for 3 days followed by 4,3,2 and 1 for 3 days each. 45 tablet 0  . traZODone (DESYREL) 100 MG tablet Take 1 tablet (100 mg total) by mouth at bedtime. 30 tablet 0  . venlafaxine XR (EFFEXOR-XR) 75 MG 24 hr capsule TAKE ONE CAPSULE BY MOUTH ONCE DAILY WITH BREAKFAST 30 capsule 0   No current facility-administered medications for this visit.    Previous Psychotropic Medications: Yes  SSRI name not known. Had difficult time getting off from effexor.    Substance Abuse History in the last 12 months:  No.  Consequences of Substance Abuse: Negative or NA    Treatment Plan Summary: MDD continue effexor but at the current lower dose of 75mg  to avoid weight gain or itneraction with weight loss regimen continue prozac 20mg .  Panic disorder and  GAD. Continue xanax 0.5 mg since she is on it for long time. Expected to work on lowering dose later. Continue prozac as well for anxiety.  Avoid regular use of xanax  Insomnia; probably related to sleep apnea.increase trazadone 100mg . Also discussed sleep hygiene, cut down caffeine. No change in dosages.   call 911 or ED visit for any urgent need or suicidal tougths. Follow up in 8  weeks        Kire Ferg 3/15/201711:44 AM

## 2015-11-10 NOTE — Progress Notes (Signed)
BP 120/71 mmHg  Pulse 61  Temp(Src) 97.7 F (36.5 C) (Oral)  Ht 5\' 6"  (1.676 m)  Wt 232 lb (105.235 kg)  BMI 37.46 kg/m2   Subjective:    Patient ID: Mary Cox, female    DOB: 1981/07/01, 35 y.o.   MRN: [id number]  HPI: Mary Cox is a 35 y.o. female presenting on 11/10/2015 for Left calf pain   HPI Left calf pain Patient has been having left calf pain that started yesterday after work where she does a lot of bending down and lifting and going up and down and picking up things from the floor. The pain is right in the center of her left calf muscle. She denies any swelling or inflammation or color change on the skin. The pain is worse with dorsiflexion but improves with plantar flexion. There is no other pain in the ankle or the knee or the hip of that leg. She does have pain with ambulation but is able to bear weight. She does not recall any specific trauma. She denies any fevers or chills.   Relevant past medical, surgical, family and social history reviewed and updated as indicated. Interim medical history since our last visit reviewed. Allergies and medications reviewed and updated.  Review of Systems  Constitutional: Negative for fever and chills.  HENT: Negative for congestion, ear discharge and ear pain.   Eyes: Negative for redness and visual disturbance.  Respiratory: Negative for chest tightness and shortness of breath.   Cardiovascular: Negative for chest pain and leg swelling.  Genitourinary: Negative for dysuria and difficulty urinating.  Musculoskeletal: Positive for myalgias. Negative for back pain, arthralgias and gait problem.  Skin: Negative for rash.  Neurological: Negative for light-headedness and headaches.  Psychiatric/Behavioral: Negative for behavioral problems and agitation.  All other systems reviewed and are negative.   Per HPI unless specifically indicated above     Medication List       This list is accurate as of: 11/10/15  5:04 PM.   Always use your most recent med list.               ALPRAZolam 0.5 MG tablet  Commonly known as:  XANAX  Take 1 tablet (0.5 mg total) by mouth daily.     cyclobenzaprine 10 MG tablet  Commonly known as:  FLEXERIL  Take 1 tablet (10 mg total) by mouth 3 (three) times daily as needed for muscle spasms.     diclofenac 75 MG EC tablet  Commonly known as:  VOLTAREN  Take 1 tablet (75 mg total) by mouth 2 (two) times daily.     estrogens (conjugated) 0.625 MG tablet  Commonly known as:  PREMARIN  Take 0.625 mg by mouth.     FLUoxetine 20 MG tablet  Commonly known as:  PROZAC  Take 1 tablet (20 mg total) by mouth daily.     gabapentin 600 MG tablet  Commonly known as:  NEURONTIN  TAKE ONE TABLET BY MOUTH THREE TIMES DAILY     LINZESS 290 MCG Caps capsule  Generic drug:  Linaclotide  Take 290 mcg by mouth daily.     Liraglutide 18 MG/3ML Sopn  Inject 3 mLs (18 mg total) into the skin at bedtime.     predniSONE 10 MG tablet  Commonly known as:  DELTASONE  Take 5 daily for 3 days followed by 4,3,2 and 1 for 3 days each.     traZODone 100 MG tablet  Commonly known as:  DESYREL  Take 1 tablet (100 mg total) by mouth at bedtime.     venlafaxine XR 75 MG 24 hr capsule  Commonly known as:  EFFEXOR-XR  TAKE ONE CAPSULE BY MOUTH ONCE DAILY WITH BREAKFAST           Objective:    BP 120/71 mmHg  Pulse 61  Temp(Src) 97.7 F (36.5 C) (Oral)  Ht 5\' 6"  (1.676 m)  Wt 232 lb (105.235 kg)  BMI 37.46 kg/m2  Wt Readings from Last 3 Encounters:  11/10/15 232 lb (105.235 kg)  11/10/15 232 lb (105.235 kg)  11/02/15 232 lb 12.8 oz (105.597 kg)    Physical Exam  Constitutional: She is oriented to person, place, and time. She appears well-developed and well-nourished. No distress.  Eyes: Conjunctivae and EOM are normal. Pupils are equal, round, and reactive to light.  Neck: Neck supple. No thyromegaly present.  Cardiovascular: Normal rate, regular rhythm, normal heart sounds and  intact distal pulses.   No murmur heard. Pulmonary/Chest: Effort normal and breath sounds normal. No respiratory distress. She has no wheezes.  Musculoskeletal: Normal range of motion. She exhibits tenderness (Calf muscle tenderness). She exhibits no edema.  Lymphadenopathy:    She has no cervical adenopathy.  Neurological: She is alert and oriented to person, place, and time. No cranial nerve deficit. Coordination normal.  Skin: Skin is warm and dry. No rash noted. She is not diaphoretic.  Psychiatric: She has a normal mood and affect. Her behavior is normal.  Nursing note and vitals reviewed.  Results for orders placed or performed in visit on 10/15/15  HM PAP SMEAR  Result Value Ref Range   HM Pap smear ASCUS-perfomed by Marlane Mingle, PA-C       Assessment & Plan:   Problem List Items Addressed This Visit    None    Visit Diagnoses    Strain of calf muscle, left, initial encounter    -  Primary    Recommended ice heat, ibuprofen, stretching and muscle relaxer       Follow up plan: Return in about 4 weeks (around 12/08/2015), or if symptoms worsen or fail to improve, for Weight check.  Counseling provided for all of the vaccine components No orders of the defined types were placed in this encounter.    Caryl Pina, MD [facility] 11/10/2015, 5:04 PM

## 2015-11-11 ENCOUNTER — Ambulatory Visit (HOSPITAL_COMMUNITY): Payer: Self-pay | Admitting: Psychiatry

## 2015-11-11 ENCOUNTER — Telehealth: Payer: Self-pay

## 2015-11-11 NOTE — Telephone Encounter (Signed)
Medicaid non preferred Victoza  Preferred are Byetta Pen Bydureon Pen and Tanzeum Pen

## 2015-11-11 NOTE — Telephone Encounter (Signed)
I cannot see in the chart were we have been treating her diabetes so not sure how to respond to this note

## 2015-11-16 NOTE — Telephone Encounter (Signed)
Dr Sabra Heck doesn't know why this was ordered  Please address

## 2015-11-17 ENCOUNTER — Telehealth: Payer: Self-pay

## 2015-11-17 NOTE — Telephone Encounter (Signed)
Okay thanks, we will just have to discuss further options. I believe patient has a follow-up appointment to discuss some options if not have her make 1.

## 2015-11-17 NOTE — Telephone Encounter (Signed)
Medicaid will not cover Victoza for weight loss

## 2015-11-17 NOTE — Telephone Encounter (Signed)
Victoza was ordered for weight loss not diabetes. It does have an indication for weight loss and most insurance companies will cover it for that but if not then we have to discuss other options.

## 2015-11-20 NOTE — Telephone Encounter (Signed)
Patient aware and will give [place] a call back to schedule appointment.

## 2015-11-23 ENCOUNTER — Encounter: Payer: Self-pay | Admitting: Family Medicine

## 2015-11-23 ENCOUNTER — Ambulatory Visit (INDEPENDENT_AMBULATORY_CARE_PROVIDER_SITE_OTHER): Payer: Managed Care, Other (non HMO) | Admitting: Family Medicine

## 2015-11-23 VITALS — BP 122/75 | HR 69 | Temp 97.6°F | Ht 66.0 in | Wt 237.2 lb

## 2015-11-23 DIAGNOSIS — M79662 Pain in left lower leg: Secondary | ICD-10-CM | POA: Diagnosis not present

## 2015-11-23 NOTE — Progress Notes (Signed)
   HPI  Patient presents today here with left calf pain.  Patient explains that after her last visit,she improved slightly but not completely. About 2 days ago her visit she missed a step while she is leaving the house and felllanding with one legacross her left calf. She had severe pain after walking all [facility], today it seems to be improved but is persistent.  She's taking ibuprofen and Voltaren, we discussed to discontinue ibuprofen She's also taking muscle relaxers without any improvement. She has improved with rest  She denies any erythema, bruising, orother outward signs of injury.   PMH: Smoking status noted ROS: Per HPI  Objective: BP 122/75 mmHg  Pulse 69  Temp(Src) 97.6 F (36.4 C) (Oral)  Ht 5\' 6"  (1.676 m)  Wt 237 lb 3.2 oz (107.593 kg)  BMI 38.30 kg/m2 Gen: NAD, alert, cooperative with exam HEENT: NCAT Ext: No edema, warm Neuro: Alert and oriented, No gross deficits MSK: no tenderness to palpation of the calves, bilaterally symmetric with no erythema or gross swelling. Circumference of the calf is 42 cm on the right, 43 cm on the leftno tenderness to palpation of the belly of the calf muscle No tenderness of the tibia to palpation patient walking normally  Assessment and plan:  # calf pain/injury Reassurance Rest, elevation, TED hose Discussed appropriate NSAID dosing Ice No concern for DVT or bony injury RTC with any concerns, discussed signs of DVT, welcomed back if needed Continue muscle relaxers   Laroy Apple, MD [facility] 11/23/2015, 5:09 PM

## 2015-12-06 ENCOUNTER — Other Ambulatory Visit (HOSPITAL_COMMUNITY): Payer: Self-pay | Admitting: Psychiatry

## 2015-12-06 NOTE — Telephone Encounter (Signed)
Received medication request from [facility] for Effexor 75mg . Per Dr. De Nurse, medication request for Effexor 75mg , #30. Rx was sent to pharmacy. Pt is schedule for a f/u appt on 01/13/16. Called and informed pt of rx status. Pt verbalizes understanding.

## 2015-12-08 ENCOUNTER — Ambulatory Visit: Payer: Managed Care, Other (non HMO) | Admitting: Family Medicine

## 2015-12-09 ENCOUNTER — Encounter: Payer: Self-pay | Admitting: Family Medicine

## 2015-12-14 ENCOUNTER — Other Ambulatory Visit (HOSPITAL_COMMUNITY): Payer: Self-pay | Admitting: Psychiatry

## 2015-12-15 ENCOUNTER — Encounter: Payer: Self-pay | Admitting: Family Medicine

## 2015-12-15 NOTE — Telephone Encounter (Signed)
Xanax printed for pick up

## 2015-12-17 ENCOUNTER — Telehealth: Payer: Self-pay | Admitting: Family Medicine

## 2015-12-20 NOTE — Telephone Encounter (Signed)
We have are he tried to send a couple of things. If she could have her call her insurance company and find out what weight loss medications they will cover and then we can decide from there.

## 2015-12-20 NOTE — Telephone Encounter (Signed)
Spoke to pt and she will call her insurance company and find out if there is anything that they cover for weight loss and will call us back to let us know.

## 2015-12-23 ENCOUNTER — Telehealth: Payer: Self-pay | Admitting: Family Medicine

## 2015-12-23 MED ORDER — TOPIRAMATE 25 MG PO TABS: 25.0000 mg | ORAL_TABLET | Freq: Every day | ORAL | Status: DC

## 2015-12-23 NOTE — Telephone Encounter (Signed)
Patient aware that Rx has been sent to the pharmacy 

## 2015-12-23 NOTE — Telephone Encounter (Signed)
Again fine with starting Topamax, please do 25 mg once daily at bedtime and give her 3 months worth of refills and will recheck in one month where her weights that. If we need to go up on it at that point we'll also address it then

## 2015-12-23 NOTE — Telephone Encounter (Signed)
Please advise 

## 2016-01-03 ENCOUNTER — Other Ambulatory Visit (HOSPITAL_COMMUNITY): Payer: Self-pay | Admitting: Psychiatry

## 2016-01-06 NOTE — Telephone Encounter (Signed)
Received medication request from [facility] for Effexor 75mg . Per Dr. De Nurse, pt is authorized for a refill for Effexor 75mg , #30. Rx was sent to pharmacy. Called and informed pt of rx status. Pt verbalizes understanding. Pt is schedule for a f/u appt on 01/13/16.

## 2016-01-07 ENCOUNTER — Other Ambulatory Visit (HOSPITAL_COMMUNITY): Payer: Self-pay | Admitting: Psychiatry

## 2016-01-11 ENCOUNTER — Encounter: Payer: Self-pay | Admitting: Family Medicine

## 2016-01-13 ENCOUNTER — Ambulatory Visit (INDEPENDENT_AMBULATORY_CARE_PROVIDER_SITE_OTHER): Payer: 59 | Admitting: Psychiatry

## 2016-01-13 ENCOUNTER — Encounter (HOSPITAL_COMMUNITY): Payer: Self-pay | Admitting: Psychiatry

## 2016-01-13 DIAGNOSIS — F410 Panic disorder [episodic paroxysmal anxiety] without agoraphobia: Secondary | ICD-10-CM | POA: Diagnosis not present

## 2016-01-13 DIAGNOSIS — F411 Generalized anxiety disorder: Secondary | ICD-10-CM | POA: Diagnosis not present

## 2016-01-13 DIAGNOSIS — F331 Major depressive disorder, recurrent, moderate: Secondary | ICD-10-CM

## 2016-01-13 DIAGNOSIS — G4700 Insomnia, unspecified: Secondary | ICD-10-CM

## 2016-01-13 MED ORDER — FLUOXETINE HCL 20 MG PO TABS: 20.0000 mg | ORAL_TABLET | Freq: Every day | ORAL | Status: DC

## 2016-01-13 MED ORDER — ALPRAZOLAM 0.5 MG PO TABS: 0.5000 mg | ORAL_TABLET | Freq: Every day | ORAL | Status: DC

## 2016-01-13 MED ORDER — VENLAFAXINE HCL ER 75 MG PO CP24: ORAL_CAPSULE | ORAL | Status: DC

## 2016-01-13 NOTE — Progress Notes (Signed)
Patient ID: Mary Cox, female   DOB: 1981/08/19, 35 y.o.   MRN: [id number]  [facility] Outpatient Follow up visit  Patient Identification: Mary Cox MRN:  [id number] Date of Evaluation:  01/13/2016 Referral Source: Primary care [facility] Chief Complaint:   Chief Complaint    Follow-up     Visit Diagnosis:    ICD-9-CM ICD-10-CM   1. Major depressive disorder, recurrent episode, moderate (HCC) 296.32 F33.1   2. GAD (generalized anxiety disorder) 300.02 F41.1   3. Panic disorder 300.01 F41.0   4. Insomnia 780.52 G47.00    Diagnosis:   Patient Active Problem List   Diagnosis Date Noted  . Pain in joint of right shoulder [M25.511] 11/02/2015  . Major depressive disorder, recurrent episode, moderate ([place]) [F33.1] 02/25/2015  . Panic disorder [F41.0] 02/25/2015  . Biliary calculus with cholecystitis [K80.10] 10/14/2014  . Adult BMI 30+ [E66.8] 09/25/2014  . GERD (gastroesophageal reflux disease) [K21.9] 12/05/2012  . Generalized anxiety disorder [F41.1] 12/05/2012  . HYPERSOMNIA WITH SLEEP APNEA UNSPECIFIED [G47.10, G47.30] 09/15/2010   History of Present Illness:  Patient is a 35  years old currently separated Caucasian female living by herself . Initially presented  With depression, anxiety. Was seening Dr. Andreas Ohm and waiting for injection B-12 and phenteramine  of weight loss for which she wanted to cut down effexor. prozac and effexor helping depression. coudnt cut down further dose on effexor.  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  Panic attacks; less frequent Depression: fluctuates depending upon stress Insomnia:l takes trazadone prn. But mostly xanax to sleep or anxiety GAD: worries about her son  Tolerating medications.   Severity of depression: 7 /10. 10 being happy.  Aggravating factors; split from her husband  In 2015  after 25 years of marriage. Job stress  Modifying factors; her son 28  years of age.  She does not  endorse having any delusions or hallucinations. There is no paranoia or hallucinations when she is getting depressed.  She does not endorse using drugs or alcohol.    Psychotic Symptoms:  denies PTSD Symptoms: NA  Past Medical History:  Past Medical History  Diagnosis Date  . Anxiety     [facility], dr watts  . Epigastric pain   . Sleep apnea   . Chest pain   . Anxiety   . GERD (gastroesophageal reflux disease)   . Depression   . Cancer ([place])   . Ovarian cancer [facility])     Past Surgical History  Procedure Laterality Date  . Abdominal hysterectomy    . Appendectomy    . Hernia repair     Family History:  Family History  Problem Relation Age of Onset  . Coronary artery disease Neg Hx   . Depression Maternal Aunt   . Anxiety disorder Maternal Aunt   . Anxiety disorder Maternal Grandmother      Musculoskeletal: Strength & Muscle Tone: within normal limits Gait & Station: normal Patient leans: no lean  Psychiatric Specialty Exam: HPI  Review of Systems  Constitutional: Negative for fever.  Respiratory: Negative for cough.   Cardiovascular: Negative for chest pain and palpitations.  Gastrointestinal: Negative for nausea.  Skin: Negative for itching.  Neurological: Negative for tingling, tremors and headaches.  Psychiatric/Behavioral: Negative for hallucinations. The patient is not nervous/anxious.     There were no vitals taken for this visit.There is no weight on file to calculate BMI.  General Appearance: Casual  Eye Contact:  Fair  Speech:  Normal Rate  Volume:  Normal  Mood:  euthymic  Affect:  Congruent and more reactive  Thought Process:  Coherent  Orientation:  Full (Time, Place, and Person)  Thought Content:  Rumination  Suicidal Thoughts:  No  Homicidal Thoughts:  No  Memory:  Immediate;   Fair Recent;   Fair  Judgement:  Fair  Insight:  Shallow  Psychomotor Activity:  Normal  Concentration:  Fair  Recall:  [place]: Fair  Akathisia:  Negative  Handed:  Right  AIMS (if indicated):    Assets:  Communication Skills Desire for Improvement Financial Resources/Insurance  ADL's:  Intact  Cognition: WNL  Sleep:  Below average with frequent awakenings   Is the patient at risk to self?  No.  Allergies:   Allergies  Allergen Reactions  . Celebrex [Celecoxib] Itching  . Codeine     REACTION: hives  . Morphine And Related   . Celebrex [Celecoxib] Rash   Current Medications: Current Outpatient Prescriptions  Medication Sig Dispense Refill  . ALPRAZolam (XANAX) 0.5 MG tablet Take 1 tablet (0.5 mg total) by mouth daily. 30 tablet 1  . cyclobenzaprine (FLEXERIL) 10 MG tablet Take 1 tablet (10 mg total) by mouth 3 (three) times daily as needed for muscle spasms. 90 tablet 1  . diclofenac (VOLTAREN) 75 MG EC tablet Take 1 tablet (75 mg total) by mouth 2 (two) times daily. 60 tablet 2  . estrogens, conjugated, (PREMARIN) 0.625 MG tablet Take 0.625 mg by mouth.    Marland Kitchen FLUoxetine (PROZAC) 20 MG tablet Take 1 tablet (20 mg total) by mouth daily. 30 tablet 1  . gabapentin (NEURONTIN) 600 MG tablet TAKE ONE TABLET BY MOUTH THREE TIMES DAILY 90 tablet 1  . Linaclotide (LINZESS) 290 MCG CAPS capsule Take 290 mcg by mouth daily.    . predniSONE (DELTASONE) 10 MG tablet Take 5 daily for 3 days followed by 4,3,2 and 1 for 3 days each. 45 tablet 0  . topiramate (TOPAMAX) 25 MG tablet Take 1 tablet (25 mg total) by mouth at bedtime. 30 tablet 0  . traZODone (DESYREL) 100 MG tablet Take 1 tablet (100 mg total) by mouth at bedtime. 30 tablet 0  . venlafaxine XR (EFFEXOR-XR) 75 MG 24 hr capsule TAKE ONE CAPSULE BY MOUTH ONCE DAILY WITH BREAKFAST 30 capsule 0   No current facility-administered medications for this visit.      Treatment Plan Summary: MDD continue effexor but at the current lower dose of 75mg  to avoid weight gain or itneraction with weight loss regimen continue prozac 20mg . Now  also on topomax to loose weight Panic disorder and  GAD. Continue xanax 0.5 mg since she is on it for long time. Expected to work on lowering dose.  Continue prozac as well for anxiety.  Avoid regular use of xanax  Insomnia; probably related to sleep apnea but sleeping better.. Also discussed sleep hygiene, cut down caffeine. No change in dosages. More than 50% time spent in counseling and coordination of care including patient education  call 911 or ED visit for any urgent need or suicidal tougths. Follow up in 8  weeks    Time spent: 25 minutes    Aishah Teffeteller 5/18/20179:54 AM

## 2016-01-17 ENCOUNTER — Other Ambulatory Visit: Payer: Self-pay | Admitting: Family Medicine

## 2016-02-28 ENCOUNTER — Ambulatory Visit: Payer: Managed Care, Other (non HMO) | Attending: Orthopedic Surgery | Admitting: Physical Therapy

## 2016-02-28 DIAGNOSIS — M25511 Pain in right shoulder: Secondary | ICD-10-CM | POA: Insufficient documentation

## 2016-02-28 NOTE — Therapy (Addendum)
[facility] [place], [place], [address] Phone: [phone]   Fax:  [phone]  Physical Therapy Evaluation  Patient Details  Name: Mary Cox MRN: [id number] Date of Birth: March 10, 1981 Referring Provider: Melina Schools MD  Encounter Date: 02/28/2016      PT End of Session - 02/28/16 1422    Visit Number 1   Number of Visits 12   Date for PT Re-Evaluation 03/27/16   PT Start Time 0150   PT Stop Time 0234   PT Time Calculation (min) 44 min   Activity Tolerance Patient tolerated treatment well   Behavior During Therapy [facility] for tasks assessed/performed      Past Medical History  Diagnosis Date  . Anxiety     [place] [facility], dr watts  . Epigastric pain   . Sleep apnea   . Chest pain   . Anxiety   . GERD (gastroesophageal reflux disease)   . Depression   . Cancer ([place])   . Ovarian cancer [facility])     Past Surgical History  Procedure Laterality Date  . Abdominal hysterectomy    . Appendectomy    . Hernia repair      There were no vitals filed for this visit.       Subjective Assessment - 02/28/16 1410    Subjective My job requires I work for several hours at a time at or above shoulder level.   Limitations --  Reaching.   Patient Stated Goals Use my right arm without pain.   Currently in Pain? Yes   Pain Score 4    Pain Orientation Right   Pain Descriptors / Indicators Aching;Sharp   Pain Type Chronic pain   Pain Onset More than a month ago   Pain Frequency Constant   Aggravating Factors  Overhead activities.   Pain Relieving Factors 'Nothing really."            [facility] PT Assessment - 02/28/16 0001    Assessment   Medical Diagnosis Right shoulder pain.   Referring Provider Melina Schools MD   Onset Date/Surgical Date --  Ongoing.   Hand Dominance Right   Precautions   Precautions None   Restrictions   Weight Bearing Restrictions No   Balance Screen   Has the patient fallen in  the past 6 months No   Has the patient had a decrease in activity level because of a fear of falling?  No   Is the patient reluctant to leave their home because of a fear of falling?  No   Home Environment   Living Environment Private residence   Prior Function   Level of Independence Independent   Posture/Postural Control   Posture/Postural Control Postural limitations   Postural Limitations Rounded Shoulders;Forward head   Posture Comments Protracted scapular rt > lt.   ROM / Strength   AROM / PROM / Strength AROM;Strength   AROM   Overall AROM Comments Full active right shoulder range of motion.   Strength   Overall Strength Comments Right shoulder IR/ER= 4-/5.   Palpation   Palpation comment Tender to palpation at right acromial ridge and referred pain into right middle deltoid region.   Special Tests    Special Tests Rotator Cuff Impingement   Rotator Cuff Impingment tests Michel Bickers test   Hawkins-Kennedy test   Findings Positive   Side Right   Comments Right shoulder Impingement test positive as well and anble to elicit bilateral UE DTR's.  Ambulation/Gait   Gait Comments WNL.                   [facility] Adult PT Treatment/Exercise - 02/28/16 0001    Modalities   Modalities Electrical Stimulation   Electrical Stimulation   Electrical Stimulation Location Right shoulder.   Electrical Stimulation Action IFC   Electrical Stimulation Parameters 80-150 Hz at 100% scan x 15 minutes.   Electrical Stimulation Goals Pain                     PT Long Term Goals - 02/28/16 1424    PT LONG TERM GOAL #1   Title Independent with a HEP.   Time 4   Period Weeks   Status New   PT LONG TERM GOAL #2   Title Increase right shoulder strength to a solid 5/5 to increase stability for performance of functional activities   Time 4   Period Weeks   Status New   PT LONG TERM GOAL #3   Title Perform ADL's with pain not > 3/10.   Time 4   Period Weeks    Status New               Plan - 02/28/16 1413    Clinical Impression Statement The patient reports right shoulder pain over the last year and several cortisone shots.  She started a new job 3 months ago that requires at over above shoulder height motions over her entire work shift.  Her resting pain-level is a 3/71 but with certain movements her right shoulder pain-level rises to 7+/10.   Rehab Potential Excellent   PT Frequency 3x / week   PT Duration 4 weeks   PT Treatment/Interventions ADLs/Self Care Home Management;Cryotherapy;Electrical Stimulation;Moist Heat;Therapeutic exercise;Therapeutic activities;Ultrasound;Manual techniques   PT Next Visit Plan Combo e'stim/U/S; e'stim; RW4; SDLY ER; scapular strengthening.   Consulted and Agree with Plan of Care Patient      Patient will benefit from skilled therapeutic intervention in order to improve the following deficits and impairments:  Pain, Decreased activity tolerance, Decreased strength  Visit Diagnosis: Pain in right shoulder - Plan: PT plan of care cert/re-cert     Problem List Patient Active Problem List   Diagnosis Date Noted  . Pain in joint of right shoulder 11/02/2015  . Major depressive disorder, recurrent episode, moderate ([place]) 02/25/2015  . Panic disorder 02/25/2015  . Biliary calculus with cholecystitis 10/14/2014  . Adult BMI 30+ 09/25/2014  . GERD (gastroesophageal reflux disease) 12/05/2012  . Generalized anxiety disorder 12/05/2012  . HYPERSOMNIA WITH SLEEP APNEA UNSPECIFIED 09/15/2010   PHYSICAL THERAPY DISCHARGE SUMMARY  Visits from Start of Care: 1.  Current functional level related to goals / functional outcomes: See above.   Remaining deficits: See below.   Education / Equipment:  Plan: Patient agrees to discharge.  Patient goals were not met. Patient is being discharged due to not returning since the last visit.  ?????      Ishani Goldwasser, [place] MPT 02/28/2016, 2:36 PM  [facility] [address] [place], [place], [address] Phone: [phone]   Fax:  [phone]  Name: IVANKA KIRSHNER MRN: [id number] Date of Birth: [birth year]

## 2016-03-02 ENCOUNTER — Encounter: Payer: Self-pay | Admitting: Physical Therapy

## 2016-03-08 ENCOUNTER — Other Ambulatory Visit (HOSPITAL_COMMUNITY): Payer: Self-pay | Admitting: Psychiatry

## 2016-03-13 NOTE — Telephone Encounter (Signed)
Received fax from [facility] requesting a refill for Effexor 75mg . Medication refill was escribed to pharmacy on 01/13/16 w/ 1 refill. Per Dr. De Nurse, refill request is authorize for Effexor 75mg , #30. Refill was escribed to pharmacy. Pt is schedule for a f/u appt on 03/15/16. Called and informed pt of refill status. Pt verbalizes understanding.

## 2016-03-15 ENCOUNTER — Ambulatory Visit (HOSPITAL_COMMUNITY): Payer: Self-pay | Admitting: Psychiatry

## 2016-03-16 ENCOUNTER — Ambulatory Visit (INDEPENDENT_AMBULATORY_CARE_PROVIDER_SITE_OTHER): Payer: 59 | Admitting: Psychiatry

## 2016-03-16 ENCOUNTER — Encounter (HOSPITAL_COMMUNITY): Payer: Self-pay | Admitting: Psychiatry

## 2016-03-16 VITALS — BP 126/72 | HR 100 | Ht 66.0 in | Wt 238.0 lb

## 2016-03-16 DIAGNOSIS — G4700 Insomnia, unspecified: Secondary | ICD-10-CM

## 2016-03-16 DIAGNOSIS — F331 Major depressive disorder, recurrent, moderate: Secondary | ICD-10-CM

## 2016-03-16 DIAGNOSIS — F411 Generalized anxiety disorder: Secondary | ICD-10-CM

## 2016-03-16 DIAGNOSIS — F410 Panic disorder [episodic paroxysmal anxiety] without agoraphobia: Secondary | ICD-10-CM

## 2016-03-16 MED ORDER — ALPRAZOLAM 0.5 MG PO TABS: 0.5000 mg | ORAL_TABLET | Freq: Every day | ORAL | Status: DC

## 2016-03-16 MED ORDER — FLUOXETINE HCL 20 MG PO TABS: 20.0000 mg | ORAL_TABLET | Freq: Every day | ORAL | Status: DC

## 2016-03-16 MED ORDER — VENLAFAXINE HCL ER 75 MG PO CP24: ORAL_CAPSULE | ORAL | Status: DC

## 2016-03-16 NOTE — Progress Notes (Signed)
Patient ID: Mary Cox, female   DOB: May 22, 1981, 35 y.o.   MRN: [id number]  Mary Cox  Patient Identification: Mary Cox MRN:  [id number] Date of Evaluation:  03/16/2016 Referral Source: Primary care Mary Cox Chief Complaint:   Chief Complaint    Follow-up     Cox Diagnosis:    ICD-9-CM ICD-10-CM   1. Major depressive disorder, recurrent episode, moderate (HCC) 296.32 F33.1   2. GAD (generalized anxiety disorder) 300.02 F41.1   3. Panic disorder 300.01 F41.0   4. Insomnia 780.52 G47.00    Diagnosis:   Patient Active Problem List   Diagnosis Date Noted  . Pain in joint of right shoulder [M25.511] 11/02/2015  . Major depressive disorder, recurrent episode, moderate ([place]) [F33.1] 02/25/2015  . Panic disorder [F41.0] 02/25/2015  . Biliary calculus with cholecystitis [K80.10] 10/14/2014  . Adult BMI 30+ [E66.8] 09/25/2014  . GERD (gastroesophageal reflux disease) [K21.9] 12/05/2012  . Generalized anxiety disorder [F41.1] 12/05/2012  . HYPERSOMNIA WITH SLEEP APNEA UNSPECIFIED [G47.10, G47.30] 09/15/2010   History of Present Illness:  Patient is a 35  years old currently separated Caucasian female living by herself . Initially presented  With depression, anxiety. Was seening Mary Cox and was waiting for injection B-12 and phenteramine  of weight loss for which she wanted to cut down effexor.   prozac and effexor helping depression. Do not want to cut down further Currently going through some GI symptoms and is having a work up with endoscopy.  Working third shift effects sleep but says it pays well.  Worries about her nephew having brain tumor.  Panic attacks; less frequent Depression: fluctuates depending upon stress Insomnia:l takes trazadone prn. But mostly xanax to sleep or anxiety Cannot cut down on xanax.  No drugs or alcohol use GAD: worries about her son and nephew Tolerating medications.   Severity of depression: 7 /10. 10 being happy.   Aggravating factors; split from her husband  In 2015  after 73 years of marriage. Job stress  Modifying factors; her son 64  years of age.     Psychotic Symptoms:  denies PTSD Symptoms: NA  Past Medical History:  Past Medical History  Diagnosis Date  . Anxiety     [place] [facility], dr watts  . Epigastric pain   . Sleep apnea   . Chest pain   . Anxiety   . GERD (gastroesophageal reflux disease)   . Depression   . Cancer ([place])   . Ovarian cancer [facility])     Past Surgical History  Procedure Laterality Date  . Abdominal hysterectomy    . Appendectomy    . Hernia repair     Family History:  Family History  Problem Relation Age of Onset  . Coronary artery disease Neg Hx   . Depression Maternal Aunt   . Anxiety disorder Maternal Aunt   . Anxiety disorder Maternal Grandmother      Musculoskeletal: Strength & Muscle Tone: within normal limits Gait & Station: normal Patient leans: no lean  Psychiatric Specialty Exam: HPI  Review of Systems  Constitutional: Negative for fever.  Respiratory: Negative for cough.   Cardiovascular: Negative for chest pain and palpitations.  Gastrointestinal: Negative for nausea.  Skin: Negative for itching.  Neurological: Negative for tingling, tremors and headaches.  Psychiatric/Behavioral: Negative for suicidal ideas and hallucinations. The patient is not nervous/anxious.     Blood pressure 126/72, pulse 100, height 5\' 6"  (1.676 m), weight 238 lb (107.956 kg), SpO2  99 %.Body mass index is 38.43 kg/(m^2).  General Appearance: Casual  Eye Contact:  Fair  Speech:  Normal Rate  Volume:  Normal  Mood:  euthymic  Affect: reactive  Thought Process:  Coherent  Orientation:  Full (Time, Place, and Person)  Thought Content:  Rumination  Suicidal Thoughts:  No  Homicidal Thoughts:  No  Memory:  Immediate;   Fair Recent;   Fair  Judgement:  Fair  Insight:  Shallow  Psychomotor Activity:  Normal  Concentration:  Fair  Recall:   [place]: Fair  Akathisia:  Negative  Handed:  Right  AIMS (if indicated):    Assets:  Communication Skills Desire for Improvement Financial Resources/Insurance  ADL's:  Intact  Cognition: WNL  Sleep:  Below average with frequent awakenings   Is the patient at risk to self?  No.  Allergies:   Allergies  Allergen Reactions  . Celebrex [Celecoxib] Itching  . Codeine     REACTION: hives  . Morphine And Related   . Celebrex [Celecoxib] Rash   Current Medications: Current Outpatient Prescriptions  Medication Sig Dispense Refill  . ALPRAZolam (XANAX) 0.5 MG tablet Take 1 tablet (0.5 mg total) by mouth daily. 30 tablet 1  . cyclobenzaprine (FLEXERIL) 10 MG tablet Take 1 tablet (10 mg total) by mouth 3 (three) times daily as needed for muscle spasms. 90 tablet 1  . diclofenac (VOLTAREN) 75 MG EC tablet Take 1 tablet (75 mg total) by mouth 2 (two) times daily. 60 tablet 2  . estrogens, conjugated, (PREMARIN) 0.625 MG tablet Take 0.625 mg by mouth.    Marland Kitchen FLUoxetine (PROZAC) 20 MG tablet Take 1 tablet (20 mg total) by mouth daily. 30 tablet 1  . gabapentin (NEURONTIN) 600 MG tablet TAKE ONE TABLET BY MOUTH THREE TIMES DAILY 90 tablet 1  . Linaclotide (LINZESS) 290 MCG CAPS capsule Take 290 mcg by mouth daily.    . predniSONE (DELTASONE) 10 MG tablet Take 5 daily for 3 days followed by 4,3,2 and 1 for 3 days each. 45 tablet 0  . topiramate (TOPAMAX) 25 MG tablet TAKE ONE TABLET BY MOUTH AT BEDTIME 30 tablet 2  . traZODone (DESYREL) 100 MG tablet Take 1 tablet (100 mg total) by mouth at bedtime. 30 tablet 0  . venlafaxine XR (EFFEXOR-XR) 75 MG 24 hr capsule TAKE ONE CAPSULE BY MOUTH ONCE DAILY WITH BREAKFAST 30 capsule 0   No current facility-administered medications for this Cox.      Treatment Plan Summary: MDD continue effexor 75mg .continue prozac 20mg .  on topomax to loose weight Panic disorder and  GAD. Continue xanax 0.5 mg since she is on it for  long time. Expected to work on lowering dose.  Continue prozac as well for anxiety.  Avoid regular use of xanax   Insomnia; probably related to sleep apnea but sleeping better.. Also discussed sleep hygiene, cut down caffeine. No change in dosages. More than 50% time spent in counseling and coordination of care including patient education including her current stressor of nephew .  call 911 or ED Cox for any urgent need or suicidal tougths. Follow up in 8 to 10   weeks    Time spent: 25 minutes    Sheila Gervasi 7/20/201712:06 PM

## 2016-04-07 ENCOUNTER — Encounter: Payer: Self-pay | Admitting: Pediatrics

## 2016-04-07 ENCOUNTER — Ambulatory Visit (INDEPENDENT_AMBULATORY_CARE_PROVIDER_SITE_OTHER): Payer: Managed Care, Other (non HMO) | Admitting: Pediatrics

## 2016-04-07 VITALS — BP 125/82 | HR 87 | Temp 97.9°F | Ht 66.0 in | Wt 236.6 lb

## 2016-04-07 DIAGNOSIS — L03317 Cellulitis of buttock: Secondary | ICD-10-CM

## 2016-04-07 DIAGNOSIS — L98492 Non-pressure chronic ulcer of skin of other sites with fat layer exposed: Secondary | ICD-10-CM | POA: Insufficient documentation

## 2016-04-07 DIAGNOSIS — B379 Candidiasis, unspecified: Secondary | ICD-10-CM | POA: Diagnosis not present

## 2016-04-07 DIAGNOSIS — Z8543 Personal history of malignant neoplasm of ovary: Secondary | ICD-10-CM | POA: Diagnosis not present

## 2016-04-07 MED ORDER — DOXYCYCLINE HYCLATE 100 MG PO TABS: 100.0000 mg | ORAL_TABLET | Freq: Two times a day (BID) | ORAL | 0 refills | Status: DC

## 2016-04-07 MED ORDER — FLUCONAZOLE 150 MG PO TABS: 150.0000 mg | ORAL_TABLET | ORAL | 0 refills | Status: DC | PRN

## 2016-04-07 NOTE — Progress Notes (Signed)
    Subjective:    Patient ID: Mary Cox, female    DOB: Mar 12, 1981, 35 y.o.   MRN: [id number]  CC: Open Wound (abdomen)   HPI: Mary Cox is a 35 y.o. female presenting for Open Wound (abdomen)  For past 3-4 days has had open sores on abd Some tenderness around the sores Minimal discharge Her shirt rubs them at work One present for 2 weeks in abd midline incision scar from ovarian ca surgery done 5 years ago, now two more No prior skin problems Has had a spot on her L buttock as well showed up 3-4 days ago Some draining there as well  No fevers, no new products, no bug bites Denies drug use   Relevant past medical, surgical, family and social history reviewed and updated. Interim medical history since our last visit reviewed. Allergies and medications reviewed and updated.  History  Smoking Status  . Never Smoker  Smokeless Tobacco  . Never Used    ROS: Per HPI      Objective:    BP 125/82   Pulse 87   Temp 97.9 F (36.6 C) (Oral)   Ht 5\' 6"  (1.676 m)   Wt 236 lb 9.6 oz (107.3 kg)   BMI 38.19 kg/m   Wt Readings from Last 3 Encounters:  04/07/16 236 lb 9.6 oz (107.3 kg)  11/23/15 237 lb 3.2 oz (107.6 kg)  11/10/15 232 lb (105.2 kg)    Gen: NAD, alert, cooperative with exam, NCAT EYES: EOMI, no scleral injection or icterus CV: NRRR, normal S1/S2, no murmur, distal pulses 2+ b/l Resp: CTABL, no wheezes, normal WOB Abd: +BS, soft, NTND. no guarding or organomegaly Ext: No edema, warm Neuro: Alert and oriented Skin: L side of abd with 1cm circular ulcer, erythematous border with varied redness 2-3 CM surrounding the ulcer with fat visible in center of ulcer, TTP. Midline healed incision, with irregularly shaped apprx 1cm by 44mm ulcer, minimal drainage. R side of umbilicus with 12mm ulcer with red center, some surrounding redness. L buttock with similar 50mm red base ulcer with surround erythema, no fluctuance     Assessment & Plan:    Mary Cox was seen  today for open wound.  Diagnoses and all orders for this visit:  Ulcer of abdomen wall with fat layer exposed ([place]) 89mm punch biopsy of ulcer edge to rule out vasculitis, see separate procedure note -     Pathology  Cellulitis of buttock -     doxycycline (VIBRA-TABS) 100 MG tablet; Take 1 tablet (100 mg total) by mouth 2 (two) times daily.  Yeast infection -     fluconazole (DIFLUCAN) 150 MG tablet; Take 1 tablet (150 mg total) by mouth every 3 (three) days as needed.  PROCEDURE:  After risks and benefits and procedure itself discussed in detail, pt agreed to proceed with biopsy of skin lesion on abdomen with punch biopsy. Area was thoroughly cleaned and prepped with betadine. 0.15mL of 1% lidocaine with epi was injected under the the lesion. A 10mm punch biopsy was taken of edge of ulcer. Hemostasis achieved with silver nitrate stick. Wound dressed, wound care discussed with pt.   Follow up plan: Return in about 1 week (around 04/14/2016).  Assunta Found, MD [facility] 04/07/2016, 10:29 AM

## 2016-04-10 ENCOUNTER — Telehealth (HOSPITAL_COMMUNITY): Payer: Self-pay | Admitting: Psychiatry

## 2016-04-10 NOTE — Telephone Encounter (Signed)
Pt called office to speak with nurse about anxiety. pt refused to give any further information. lvm for pt to return call to office.

## 2016-04-11 ENCOUNTER — Ambulatory Visit (INDEPENDENT_AMBULATORY_CARE_PROVIDER_SITE_OTHER): Payer: Managed Care, Other (non HMO) | Admitting: Family Medicine

## 2016-04-11 ENCOUNTER — Encounter: Payer: Self-pay | Admitting: Family Medicine

## 2016-04-11 ENCOUNTER — Encounter: Payer: Self-pay | Admitting: *Deleted

## 2016-04-11 VITALS — BP 117/82 | HR 77 | Temp 97.0°F | Ht 66.0 in | Wt 235.4 lb

## 2016-04-11 DIAGNOSIS — L98499 Non-pressure chronic ulcer of skin of other sites with unspecified severity: Secondary | ICD-10-CM | POA: Diagnosis not present

## 2016-04-11 DIAGNOSIS — K469 Unspecified abdominal hernia without obstruction or gangrene: Secondary | ICD-10-CM | POA: Diagnosis not present

## 2016-04-11 DIAGNOSIS — K439 Ventral hernia without obstruction or gangrene: Secondary | ICD-10-CM

## 2016-04-11 LAB — PATHOLOGY

## 2016-04-11 MED ORDER — MUPIROCIN 2 % EX OINT: 1.0000 "application " | TOPICAL_OINTMENT | Freq: Two times a day (BID) | CUTANEOUS | 0 refills | Status: AC

## 2016-04-11 NOTE — Patient Instructions (Signed)
Great to see you!  Try the mupirocin, See if a coban can help you hold a dressing on without irritation. If not, just use the ointment 3-4 times a ay.   Come back to se Dr. Evette Doffing as planned.   Please come back if you are having any additional concerns

## 2016-04-11 NOTE — Progress Notes (Signed)
   HPI  Patient presents today here for follow-up of abdominal wall ulcer and chest tightness.  Patient explains that she has several things going on recently. Her predominant complaint today is about the ulcers, anxiety, and chest tightness. Anxiety-she is been talking to her psychiatrist about this. She has chest tightness only with deep inspiration, this lasts a few moments and goes away right away. She feels unwell overall but has felt this way since she developed the first ulcer on her abdomen. She denies any shortness of breath, chest pain, or palpitations.  Abdominal wall ulcer She has developed a few more red dots, the other ulcers do not seem to be improving or worsening. Her left buttock ulcer has not changed either. She has had a reaction to nearly every take she's tried.  He also requests general surgery referral for umbilical hernia related pain. She did not realize that the hernia that was in question is actually 5 cm above her umbilicus. She states she has pain with lifting  PMH: Smoking status noted ROS: Per HPI  Objective: BP 117/82   Pulse 77   Temp 97 F (36.1 C) (Oral)   Ht 5\' 6"  (1.676 m)   Wt 235 lb 6 oz (106.8 kg)   BMI 37.99 kg/m  Gen: NAD, alert, cooperative with exam HEENT: NCAT CV: RRR, good S1/S2, no murmur Resp: CTABL, no wheezes, non-labored Ext: No edema, warm Neuro: Alert and oriented, No gross deficits  Skin: Several ulcers as listed below. Left abdomen measuring circular roughly 1.3 x 0.9 cm, area of erythema 1.5 cm in diameter Umbilical ulcer irregularly shaped 0.8 x 0.7 cm. Right-sided 0.4 x 0.2 cm.  Right abdomen now with 2 additional erythematous papules measuring 3 mm in diameter and 2 mm diameter Left with 2 erythematous papules measuring approximately 2 mm each.  Left buttock cheek with 0.5 cm roughly circular ulcer with surrounding area of erythema measuring 1.3 cm in diameter. Small 0.5 cm papule on the right buttock  There  Is an area of erythema insistent with the border of tape  Assessment and plan:  # Abdominal wall ulcer Slowly progressing, also with left buttock ulcer. Measurements are almost nearly identical to previous, there are at least within the range of measurement error, believe the new papules could be from contact dermatitis. Cover with mupirocin, 3-4 times a day if unable to tolerate bandages  No signs of rapidly progressing infection, continue doxycycline for now. I am not convinced that she has actually worsened or that this is an infective process. I agree with Dr. Evette Doffing and would like to wait for biopsy results before changing treatment to drastically.  She is not tolerating bandages well, she likely has some form of contact dermatitis superimposed on this process, which could be what the new papules are from. I've given her a coban to try to see if this is not irritating  # Supraumbilical hernia Refer to general surgery per her request, no signs of incarceration, however she is having some irritation related to it.    Laroy Apple, MD [facility] 04/11/2016, 10:44 AM

## 2016-04-11 NOTE — Telephone Encounter (Signed)
Pt return call to office. Pt states she is having more panic attacks and crying spells. Pt would like to speak with Dr. De Nurse about increasing Xanax. Pt is currently taking Xanax 0.5mg , pt states it is not helping her and she can barely sleep. Informed pt provider is out of office, pt can proceed to urgent care or [facility] if her symptoms are worsen. Also, provide information for local therapist. Offered pt an earlier appt. Pt is schedule for a f/u appt on 8/23.

## 2016-04-13 ENCOUNTER — Telehealth: Payer: Self-pay | Admitting: Family Medicine

## 2016-04-13 ENCOUNTER — Ambulatory Visit: Payer: Managed Care, Other (non HMO) | Admitting: Pediatrics

## 2016-04-13 NOTE — Telephone Encounter (Signed)
Will forward to Dr. Evette Doffing.   Appears to be non specific ulbceration, Should heal with good wound care. She was supposed to have follow up today with Dr. Evette Doffing.   Laroy Apple, MD [facility] 04/13/2016, 12:06 PM

## 2016-04-17 NOTE — Telephone Encounter (Signed)
Pt called back and would like to speak to vita. Would not give any further information.

## 2016-04-17 NOTE — Telephone Encounter (Signed)
Pt phoned into office stating she is experiencing panic attacks. Pt states she has been out of work since 8/16 and would like an earlier appt. Informed pt, provider is out of the office and she would need to go to urgent care or [facility]. Pt decline and states she will wait until appt on 8/23. Pt states she will need a work excuse for the following days 8/16-8/21. Pt will call back with information for employer. Please advise.

## 2016-04-19 ENCOUNTER — Ambulatory Visit (INDEPENDENT_AMBULATORY_CARE_PROVIDER_SITE_OTHER): Payer: 59 | Admitting: Psychiatry

## 2016-04-19 ENCOUNTER — Encounter (HOSPITAL_COMMUNITY): Payer: Self-pay | Admitting: Psychiatry

## 2016-04-19 ENCOUNTER — Telehealth: Payer: Self-pay | Admitting: Family Medicine

## 2016-04-19 VITALS — BP 122/78 | HR 77 | Ht 66.0 in | Wt 237.8 lb

## 2016-04-19 DIAGNOSIS — F331 Major depressive disorder, recurrent, moderate: Secondary | ICD-10-CM

## 2016-04-19 DIAGNOSIS — G4700 Insomnia, unspecified: Secondary | ICD-10-CM

## 2016-04-19 DIAGNOSIS — F411 Generalized anxiety disorder: Secondary | ICD-10-CM | POA: Diagnosis not present

## 2016-04-19 DIAGNOSIS — F410 Panic disorder [episodic paroxysmal anxiety] without agoraphobia: Secondary | ICD-10-CM | POA: Diagnosis not present

## 2016-04-19 MED ORDER — ALPRAZOLAM 0.5 MG PO TABS: 0.5000 mg | ORAL_TABLET | Freq: Every day | ORAL | 0 refills | Status: DC

## 2016-04-19 MED ORDER — VENLAFAXINE HCL ER 75 MG PO CP24
ORAL_CAPSULE | ORAL | 0 refills | Status: DC
Start: 2016-04-19 — End: 2016-05-11

## 2016-04-19 MED ORDER — TRAZODONE HCL 100 MG PO TABS: 100.0000 mg | ORAL_TABLET | Freq: Every day | ORAL | 0 refills | Status: DC

## 2016-04-19 MED ORDER — FLUOXETINE HCL 20 MG PO TABS: 40.0000 mg | ORAL_TABLET | Freq: Every day | ORAL | 0 refills | Status: DC

## 2016-04-19 NOTE — Telephone Encounter (Signed)
Patient seen you on 8/11 and states that her abdominal wall ulcer and sore on her bottom has gotten worse. Patient states that she now has two sores on her bottom. Patient has finished her antibiotic a few days ago. Patient would like to know what she should do? Please advise

## 2016-04-19 NOTE — Telephone Encounter (Signed)
Needs to be seen

## 2016-04-19 NOTE — Telephone Encounter (Signed)
Patient aware and states she will call back to make appointment.

## 2016-04-19 NOTE — Progress Notes (Signed)
Patient ID: Mary Cox, female   DOB: [birth year], 35 y.o.   MRN: [id number]  [name]  Patient Identification: Mary Cox MRN:  [id number] Date of Evaluation:  04/19/2016 Referral Source: Primary care [facility] Chief Complaint:   Chief Complaint    Follow-up     Visit Diagnosis:    ICD-9-CM ICD-10-CM   1. Major depressive disorder, recurrent episode, moderate (HCC) 296.32 F33.1   2. GAD (generalized anxiety disorder) 300.02 F41.1   3. Panic disorder 300.01 F41.0   4. Insomnia 780.52 G47.00    Diagnosis:   Patient Active Problem List   Diagnosis Date Noted  . Ulcer of abdomen wall with fat layer exposed ([place]) [L98.492] 04/07/2016  . H/O ovarian cancer [Z85.43] 04/07/2016  . Pain in joint of right shoulder [M25.511] 11/02/2015  . Major depressive disorder, recurrent episode, moderate ([place]) [F33.1] 02/25/2015  . Panic disorder [F41.0] 02/25/2015  . Biliary calculus with cholecystitis [K80.10] 10/14/2014  . Adult BMI 30+ [E66.8] 09/25/2014  . GERD (gastroesophageal reflux disease) [K21.9] 12/05/2012  . Generalized anxiety disorder [F41.1] 12/05/2012  . HYPERSOMNIA WITH SLEEP APNEA UNSPECIFIED [G47.10, G47.30] 09/15/2010   History of Present Illness:  Patient is a 35  years old currently separated Caucasian female living by herself . Initially presented  With depression, anxiety.   Patient is going through stress because of her husband giving hard time and not giving more than child support is taking away the car off her son. Patient is also unable to function at work she has GI symptoms and have to take bathroom breaks she is having panic attacks feeling down depressed a motivated decreased energy tiredness. She had times feeling worthless with suicidal she feels that she's not able to help her son as with her condition and her husband is not helpin either in this situation They are seperated but divorce is becoming a reality and she is feeling  anxious.   Working third shift effects sleep but says it pays well. Overall cannot function at work for now and feel depressed and withdrawn. Decrease concentration and anxious Worries about her nephew having brain tumor. Her job, her son and finances.  Panic attacks; more  frequent Depression: worsen Insomnia:l takes trazadone prn. But mostly xanax to sleep or anxiety Cannot cut down on xanax.  No drugs or alcohol use GAD: worries are gone high Tolerating medications.   Severity of depression: 7 /10. 10 being happy.  Aggravating factors; split from her husband  In 2015  after 66 years of marriage. Job stress  Modifying factors; her son 67  years of age.     Psychotic Symptoms:  denies PTSD Symptoms: NA  Past Medical History:  Past Medical History:  Diagnosis Date  . Anxiety    [facility], dr watts  . Anxiety   . Cancer ([place])   . Chest pain   . Depression   . Epigastric pain   . GERD (gastroesophageal reflux disease)   . Ovarian cancer ([place])   . Sleep apnea     Past Surgical History:  Procedure Laterality Date  . ABDOMINAL HYSTERECTOMY    . APPENDECTOMY    . HERNIA REPAIR     Family History:  Family History  Problem Relation Age of Onset  . Depression Maternal Aunt   . Anxiety disorder Maternal Aunt   . Anxiety disorder Maternal Grandmother   . Coronary artery disease Neg Hx      Musculoskeletal: Strength & Muscle Tone:  within normal limits Gait & Station: normal Patient leans: no lean  Psychiatric Specialty Exam: HPI  Review of Systems  Constitutional: Negative for fever.  Respiratory: Negative for cough.   Cardiovascular: Negative for chest pain.  Skin: Negative for itching.  Neurological: Negative for tremors and headaches.  Psychiatric/Behavioral: Positive for depression. Negative for hallucinations and suicidal ideas. The patient is nervous/anxious.     Blood pressure 122/78, pulse 77, height 5\' 6"  (1.676 m), weight 237 lb 12.8  oz (107.9 kg), SpO2 97 %.Body mass index is 38.38 kg/m.  General Appearance: Casual  Eye Contact:  Fair  Speech:  Normal Rate  Volume:  Normal  Mood:  Anxious, depressed  Affect: dysphoric  Thought Process:  Coherent  Orientation:  Full (Time, Place, and Person)  Thought Content:  Rumination  Suicidal Thoughts:  No  Homicidal Thoughts:  No  Memory:  Immediate;   Fair Recent;   Fair  Judgement:  Fair  Insight:  Shallow  Psychomotor Activity:  Normal  Concentration:  Fair  Recall:  [place]: Fair  Akathisia:  Negative  Handed:  Right  AIMS (if indicated):    Assets:  Communication Skills Desire for Improvement Financial Resources/Insurance  ADL's:  Intact  Cognition: WNL  Sleep:  Below average with frequent awakenings   Is the patient at risk to self?  No.  Allergies:   Allergies  Allergen Reactions  . Celebrex [Celecoxib] Itching  . Codeine     REACTION: hives  . Morphine And Related   . Celebrex [Celecoxib] Rash   Current Medications: Current Outpatient Prescriptions  Medication Sig Dispense Refill  . ALPRAZolam (XANAX) 0.5 MG tablet Take 1 tablet (0.5 mg total) by mouth daily. 30 tablet 0  . cyclobenzaprine (FLEXERIL) 10 MG tablet Take 1 tablet (10 mg total) by mouth 3 (three) times daily as needed for muscle spasms. 90 tablet 1  . diclofenac (VOLTAREN) 75 MG EC tablet Take 1 tablet (75 mg total) by mouth 2 (two) times daily. 60 tablet 2  . doxycycline (VIBRA-TABS) 100 MG tablet Take 1 tablet (100 mg total) by mouth 2 (two) times daily. 14 tablet 0  . estrogens, conjugated, (PREMARIN) 0.625 MG tablet Take 0.625 mg by mouth.    . fluconazole (DIFLUCAN) 150 MG tablet Take 1 tablet (150 mg total) by mouth every 3 (three) days as needed. 2 tablet 0  . FLUoxetine (PROZAC) 20 MG tablet Take 2 tablets (40 mg total) by mouth daily. 60 tablet 0  . gabapentin (NEURONTIN) 600 MG tablet TAKE ONE TABLET BY MOUTH THREE TIMES DAILY 90 tablet 1  .  Linaclotide (LINZESS) 290 MCG CAPS capsule Take 290 mcg by mouth daily.    . mupirocin ointment (BACTROBAN) 2 % Place 1 application into the nose 2 (two) times daily. 22 g 0  . topiramate (TOPAMAX) 25 MG tablet TAKE ONE TABLET BY MOUTH AT BEDTIME 30 tablet 2  . traZODone (DESYREL) 100 MG tablet Take 1 tablet (100 mg total) by mouth at bedtime. 30 tablet 0  . venlafaxine XR (EFFEXOR-XR) 75 MG 24 hr capsule TAKE ONE CAPSULE BY MOUTH ONCE DAILY WITH BREAKFAST 30 capsule 0   No current facility-administered medications for this visit.       Treatment Plan Summary: MDD continue effexor 75mg . Increase  prozac from 20mg  to 40mg    on topomax to loose weight Panic disorder and  GAD. Continue xanax 0.5 mg   Continue prozac and will increase as above  Avoid regular use of xanax once anxiety somewhat better With her current status of extreme anxiety and feeling of helplessness a motivational ride down homework not to be off work until she is stable to return  Recommend psychotherapy and list of providers provided.  Insomnia;  Continue trazadone Also discussed sleep hygiene, cut down caffeine.  More than 50% time spent in counseling and coordination of care including patient education including her current stressor of nephew .  call 911 or ED visit for any urgent need or suicidal tougths. Follow up in 3-4 weeks    Time spent: 25 minutes    Tabby Beaston 8/23/201711:52 AM

## 2016-04-22 ENCOUNTER — Other Ambulatory Visit: Payer: Self-pay | Admitting: Family Medicine

## 2016-04-24 NOTE — Telephone Encounter (Signed)
Patient has been seen. See note and plan

## 2016-05-02 ENCOUNTER — Telehealth (HOSPITAL_COMMUNITY): Payer: Self-pay | Admitting: Psychiatry

## 2016-05-02 ENCOUNTER — Encounter (HOSPITAL_COMMUNITY): Payer: Self-pay | Admitting: Psychiatry

## 2016-05-02 ENCOUNTER — Encounter (HOSPITAL_COMMUNITY): Payer: Self-pay | Admitting: Licensed Clinical Social Worker

## 2016-05-02 ENCOUNTER — Ambulatory Visit (INDEPENDENT_AMBULATORY_CARE_PROVIDER_SITE_OTHER): Payer: 59 | Admitting: Licensed Clinical Social Worker

## 2016-05-02 DIAGNOSIS — G4700 Insomnia, unspecified: Secondary | ICD-10-CM | POA: Diagnosis not present

## 2016-05-02 DIAGNOSIS — F410 Panic disorder [episodic paroxysmal anxiety] without agoraphobia: Secondary | ICD-10-CM

## 2016-05-02 DIAGNOSIS — F411 Generalized anxiety disorder: Secondary | ICD-10-CM | POA: Diagnosis not present

## 2016-05-02 DIAGNOSIS — F331 Major depressive disorder, recurrent, moderate: Secondary | ICD-10-CM

## 2016-05-02 NOTE — Telephone Encounter (Signed)
Printed for pick up. Letter to remain off work for 10 days

## 2016-05-03 NOTE — Progress Notes (Signed)
Comprehensive Clinical Assessment (CCA) Note  05/03/2016 SAMANI SZARKA [id number]  Visit Diagnosis:      ICD-9-CM ICD-10-CM   1. Major depressive disorder, recurrent episode, moderate (HCC) 296.32 F33.1   2. GAD (generalized anxiety disorder) 300.02 F41.1   3. Panic disorder 300.01 F41.0   4. Insomnia 780.52 G47.00       CCA Part One  Part One has been completed on paper by the patient.  (See scanned document in Chart Review)  CCA Part Two A  Intake/Chief Complaint:  CCA Intake With Chief Complaint CCA Part Two Date: 05/02/16 CCA Part Two Time: 1602 Chief Complaint/Presenting Problem: She has been having panic attacks pretty bad lately. She thought was getting back in rhyme. Nephew has brain cancer, doing radiation and the tumor shrunk. He is doing chemo, blood pressure won't stay up and right side numb, so moving MRI moved up tomorrow. she wants to fix him to help him feel better. She had ovarian cancer five years ago. She can't understand why she is cancer free and he is not. She is going through a divorce for past two years. Son doesn't want to see daddy and she is getting blame for that but he is 36 years old. Stress because son is being rebellious.  Stress related to step kids.  Patients Currently Reported Symptoms/Problems: Panic started over the weekend, but hasn't slept well over the past couple of nights. On Trazadone and coming to the point where she hasn't needed it. When she has anxiety she feels like she is not breathing right, she feels tired, a bad panic attacks she feels her hands get numb and doesn't want to do anything. It comes when anxious at first and gets upset and wants it to go away. Heart goes out of rhythm. The length depends. This morning she didn't feel like doing anything.   Collateral Involvement: no Individual's Strengths: she feels she has a good personality when she is feeling good Individual's Preferences: She thinks that it could help her cope, help her to  deal with things and let things go that she does not have control over. Individual's Abilities: watch movies, watch son play football,  Type of Services Patient Feels Are Needed: medication management, individual therapy Initial Clinical Notes/Concerns: Psychiatric history-saw Dr. Tereasa Coop for about a year, she talked to somebody at the [facility] while going through treatment, otherwise it has been a long time since she has seen a counselor and a psychiatrist.   Mental Health Symptoms Depression:  Depression: Change in energy/activity, Difficulty Concentrating, Fatigue, Hopelessness, Increase/decrease in appetite, Irritability, Sleep (too much or little), Tearfulness, Worthlessness (denies SI, denies, past SI, past SA or SIB)  Mania:  Mania: N/A  Anxiety:   Anxiety: Difficulty concentrating, Fatigue, Irritability, Restlessness, Sleep, Tension, Worrying (excessively, not every day, but almost every day, stupid things, has what if thoughts and the wonders how she is going to cope, )  Psychosis:  Psychosis: N/A  Trauma:  Trauma: N/A  Obsessions:  Obsessions: N/A  Compulsions:  Compulsions: N/A  Inattention:  Inattention: Avoids/dislikes activities that require focus, Disorganized, Does not follow instructions (not oppositional), Does not seem to listen, Fails to pay attention/makes careless mistakes, Forgetful, Loses things, Poor follow-through on tasks, Symptoms present in 2 or more settings (people tell her that she is ADD that she starts one subject and then changes to another and doesn't focus)  Hyperactivity/Impulsivity:  Hyperactivity/Impulsivity: N/A  Oppositional/Defiant Behaviors:  Oppositional/Defiant Behaviors: N/A  Borderline Personality:  Emotional Irregularity:  N/A  Other Mood/Personality Symptoms:  Other Mood/Personality Symptoms: Describes panic-a couple times a week, last one-last night, she has had anxiety for years, she had done pretty well on medicine, when diagnosis cancer she  got worse, then better, it has gotten worse for last 2-3 weeks.    Mental Status Exam Appearance and self-care  Stature:  Stature: Average  Weight:  Weight: Overweight  Clothing:  Clothing: Casual  Grooming:  Grooming: Normal  Cosmetic use:  Cosmetic Use: None  Posture/gait:  Posture/Gait: Normal  Motor activity:  Motor Activity: Not Remarkable  Sensorium  Attention:  Attention: Normal  Concentration:  Concentration: Normal  Orientation:  Orientation: X5  Recall/memory:  Recall/Memory: Normal  Affect and Mood  Affect:  Affect: Appropriate  Mood:  Mood: Anxious, Depressed  Relating  Eye contact:  Eye Contact: Normal  Facial expression:  Facial Expression: Responsive  Attitude toward examiner:  Attitude Toward Examiner: Cooperative  Thought and Language  Speech flow: Speech Flow: Normal  Thought content:  Thought Content: Appropriate to mood and circumstances  Preoccupation:     Hallucinations:     Organization:     Transport planner of Knowledge:  Fund of Knowledge: Average  Intelligence:  Intelligence: Average  Abstraction:  Abstraction: Normal  Judgement:  Judgement: Fair  Art therapist:  Reality Testing: Realistic  Insight:  Insight: Fair  Decision Making:  Decision Making: Paralyzed  Social Functioning  Social Maturity:  Social Maturity: Isolates  Social Judgement:  Social Judgement: Normal  Stress  Stressors:  Stressors: Chiropodist (newphew, son, ex-husband)  Coping Ability:  Coping Ability: English as a second language teacher Deficits:     Supports:      Family and Psychosocial History: Family history Marital status: Separated Separated, when?: a little over two years What types of issues is patient dealing with in the relationship?: son doesn't want to stay with dad, ex won't address issues and patient becomes the person in the middle, she was married for 16 years and thinking more of the reality of it. Additional relationship information: lives with son, main supports  son and mom Are you sexually active?: No What is your sexual orientation?: heterosexual Has your sexual activity been affected by drugs, alcohol, medication, or emotional stress?: stress Does patient have children?: Yes How many children?: 1 How is patient's relationship with their children?: [name]-16 good relationship but he tries to rebel. He doesn't do that with dad any may be why he doesn't go over there.  Childhood History:  Childhood History By whom was/is the patient raised?: Mother Additional childhood history information: mom and dad separated and divorced when she was 3. mom raised her and three siblings on own and remarried when she was 79. She moved out at 171/2 and married at 71. Sister is 65 years older so sister like a second mom with mom. the only thing she remembers is brothers and sisters fussing. Dad would be there and gone as a pattern until mom asked him to leave and mom would be working.  Description of patient's relationship with caregiver when they were a child: mom-good, dad-wasn't good until past 4-5 years Patient's description of current relationship with people who raised him/her: mom-good, dad-gets along but doesn't see a lot of him How were you disciplined when you got in trouble as a child/adolescent?: She wasn't disciplined as a kid. Mom didn't have time to think about it. If she does she doesn't remember Does patient have siblings?: Yes Number of Siblings: 3 Description of patient's  current relationship with siblings: two older brothers and an older sister Did patient suffer any verbal/emotional/physical/sexual abuse as a child?: No Did patient suffer from severe childhood neglect?: No Has patient ever been sexually abused/assaulted/raped as an adolescent or adult?: No Was the patient ever a victim of a crime or a disaster?: No Witnessed domestic violence?: No Has patient been effected by domestic violence as an adult?: No  CCA Part Two B  Employment/Work  Situation: Employment / Work Copywriter, advertising Employment situation: On disability Why is patient on disability: short-term disability How long has patient been on disability: out for three works-work at [employer] job has been impacted by current illness: Yes Describe how patient's job has been impacted: She hasn't been able to do it. it is loud and the whole time she is standing around she is thinking. When the phone rings she is worried that it could be her son or sister.  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 [place] Has patient ever been in the [employer]?: No Has patient ever served in combat?: No Did You Receive Any Psychiatric Treatment/Services While in Passenger transport manager?: No Are There Guns or Other Weapons in [place]?: No  Education: Museum/gallery curator Currently Attending: no Last Grade Completed: 12 Name of [place]: [place] Did Teacher, adult education From [employer]?: Yes Did Physicist, medical?: No Did You Have Any Chief Technology Officer In School?: wants to do something with nursing-oncology, likes criminal justice Did You Have An Individualized Education Program (IIEP): No Did You Have Any Difficulty At School?: No  Religion: Religion/Spirituality Are You A Religious Person?: Yes What is Your Religious Affiliation?: Baptist How Might This Affect Treatment?: no  Leisure/Recreation: Leisure / Recreation Leisure and Hobbies: watch movies,   Exercise/Diet: Exercise/Diet Do You Exercise?: No Have You Gained or Lost A Significant Amount of Weight in the Past Six Months?: No Do You Follow a Special Diet?: No Do You Have Any Trouble Sleeping?: Yes Explanation of Sleeping Difficulties: trouble going to sleep, staying asleep, going back to sleep  CCA Part Two C  Alcohol/Drug Use: Alcohol / Drug Use Pain Medications: n/a Prescriptions: see med  list Over the Counter: see med list History of alcohol / drug use?: No history of alcohol / drug abuse                      CCA Part Three  ASAM's:  Six Dimensions of Multidimensional Assessment  Dimension 1:  Acute Intoxication and/or Withdrawal Potential:     Dimension 2:  Biomedical Conditions and Complications:     Dimension 3:  Emotional, Behavioral, or Cognitive Conditions and Complications:     Dimension 4:  Readiness to Change:     Dimension 5:  Relapse, Continued use, or Continued Problem Potential:     Dimension 6:  Recovery/Living Environment:      Substance use Disorder (SUD)    Social Function:  Social Functioning Social Maturity: Isolates Social Judgement: Normal  Stress:  Stress Stressors: Money (newphew, son, ex-husband) Coping Ability: Overwhelmed Patient Takes Medications The Way The Doctor Instructed?: Yes Priority Risk: Low Acuity  Risk Assessment- Self-Harm Potential: Risk Assessment For Self-Harm Potential Thoughts of Self-Harm: No current thoughts Method: No plan Availability of Means: No access/NA  Risk Assessment -Dangerous to Others Potential: Risk Assessment For Dangerous to Others Potential Method: No Plan Availability of  Means: No access or NA Intent: Vague intent or NA Notification Required: No need or identified person  DSM5 Diagnoses: Patient Active Problem List   Diagnosis Date Noted  . Ulcer of abdomen wall with fat layer exposed ([place]) 04/07/2016  . H/O ovarian cancer 04/07/2016  . Pain in joint of right shoulder 11/02/2015  . Major depressive disorder, recurrent episode, moderate ([place]) 02/25/2015  . Panic disorder 02/25/2015  . Biliary calculus with cholecystitis 10/14/2014  . Adult BMI 30+ 09/25/2014  . GERD (gastroesophageal reflux disease) 12/05/2012  . Generalized anxiety disorder 12/05/2012  . HYPERSOMNIA WITH SLEEP APNEA UNSPECIFIED 09/15/2010    Patient Centered Plan: Patient is on the following Treatment  Plan(s):  Anxiety and Depression  Recommendations for Services/Supports/Treatments: Recommendations for Services/Supports/Treatments Recommendations For Services/Supports/Treatments: Individual Therapy, Medication Management  Treatment Plan Summary: Patient is a 35 year old separated female referred by Dr. De Nurse for therapy. Patient relates that in the past 2-3 weeks her panic attacks have been getting worse, insomnia is getting worse, describes excessive worrying, problems with attention and depressive symptoms. She has a long history of anxiety that is pretty well managed with medication but notices that when she has increased stressors at gets worse. She describes stressors as her nephew who is going through cancer treatment(patient was treated for ovarian cancer 5 years ago and cancer free), son who is a teenager and is in rebellious years, financial stressors and stress related to divorce. She denies current SI, past SI, past SA, HI or drug and alcohol abuse. She is recommended to continue medication management as well as individual therapy to work on coping strategies for anxiety, panic, depression, psychoeducation and supportive interventions.    Referrals to Alternative Service(s): Referred to Alternative Service(s):   Place:   Date:   Time:    Referred to Alternative Service(s):   Place:   Date:   Time:    Referred to Alternative Service(s):   Place:   Date:   Time:    Referred to Alternative Service(s):   Place:   Date:   Time:     Bowman,Mary A

## 2016-05-11 ENCOUNTER — Encounter (HOSPITAL_COMMUNITY): Payer: Self-pay | Admitting: *Deleted

## 2016-05-11 ENCOUNTER — Encounter (HOSPITAL_COMMUNITY): Payer: Self-pay | Admitting: Psychiatry

## 2016-05-11 ENCOUNTER — Ambulatory Visit (INDEPENDENT_AMBULATORY_CARE_PROVIDER_SITE_OTHER): Payer: 59 | Admitting: Psychiatry

## 2016-05-11 VITALS — BP 128/74 | HR 86 | Resp 16 | Ht 66.0 in | Wt 235.0 lb

## 2016-05-11 DIAGNOSIS — G4700 Insomnia, unspecified: Secondary | ICD-10-CM

## 2016-05-11 DIAGNOSIS — F331 Major depressive disorder, recurrent, moderate: Secondary | ICD-10-CM

## 2016-05-11 DIAGNOSIS — F410 Panic disorder [episodic paroxysmal anxiety] without agoraphobia: Secondary | ICD-10-CM

## 2016-05-11 DIAGNOSIS — F411 Generalized anxiety disorder: Secondary | ICD-10-CM | POA: Diagnosis not present

## 2016-05-11 MED ORDER — VENLAFAXINE HCL ER 75 MG PO CP24: ORAL_CAPSULE | ORAL | 0 refills | Status: DC

## 2016-05-11 MED ORDER — FLUOXETINE HCL 20 MG PO TABS: 40.0000 mg | ORAL_TABLET | Freq: Every day | ORAL | 0 refills | Status: DC

## 2016-05-11 NOTE — Progress Notes (Signed)
Patient ID: Mary Cox, female   DOB: 1980/09/28, 35 y.o.   MRN: [id number]  [facility] Outpatient Follow up visit  Patient Identification: Mary Cox MRN:  [id number] Date of Evaluation:  05/11/2016 Referral Source: Primary care [facility] Chief Complaint:   Chief Complaint    Follow-up     Visit Diagnosis:    ICD-9-CM ICD-10-CM   1. Major depressive disorder, recurrent episode, moderate (HCC) 296.32 F33.1   2. GAD (generalized anxiety disorder) 300.02 F41.1   3. Panic disorder 300.01 F41.0   4. Insomnia 780.52 G47.00    Diagnosis:   Patient Active Problem List   Diagnosis Date Noted  . Ulcer of abdomen wall with fat layer exposed ([place]) [L98.492] 04/07/2016  . H/O ovarian cancer [Z85.43] 04/07/2016  . Pain in joint of right shoulder [M25.511] 11/02/2015  . Major depressive disorder, recurrent episode, moderate ([place]) [F33.1] 02/25/2015  . Panic disorder [F41.0] 02/25/2015  . Biliary calculus with cholecystitis [K80.10] 10/14/2014  . Adult BMI 30+ [E66.8] 09/25/2014  . GERD (gastroesophageal reflux disease) [K21.9] 12/05/2012  . Generalized anxiety disorder [F41.1] 12/05/2012  . HYPERSOMNIA WITH SLEEP APNEA UNSPECIFIED [G47.10, G47.30] 09/15/2010   History of Present Illness:  Patient is a 35  years old currently separated Caucasian female living by herself . Initially presented  With depression, anxiety.   Patient is going through stress because of her husband giving hard time and not giving more than child support is taking away the car off her son.  Patient is extremely stressed last visit we increased the Prozac to 40 mg that is felt she is trying to adjust to reality sleep energy level has improved some but she still feels not able to go to work she is planning to join work in the next few days.   Working third shift effects sleep but says it pays well. Worries about her nephew having brain tumor. Her job, her son and finances.  Panic attacks; more  frequent Depression:  worsen Insomnia:l takes trazadone prn. But mostly xanax to sleep or anxiety Cannot cut down on xanax.  No drugs or alcohol use GAD: worries are gone high Tolerating medications.   Severity of depression: 7 /10. 10 being happy.  Aggravating factors; split from her husband  In 2015  after 67 years of marriage. Job stress  Modifying factors; her son 12  years of age.     Psychotic Symptoms:  denies PTSD Symptoms: NA  Past Medical History:  Past Medical History:  Diagnosis Date  . Anxiety    [facility], dr watts  . Anxiety   . Cancer ([place])   . Chest pain   . Depression   . Epigastric pain   . GERD (gastroesophageal reflux disease)   . Ovarian cancer ([place])   . Sleep apnea     Past Surgical History:  Procedure Laterality Date  . ABDOMINAL HYSTERECTOMY    . APPENDECTOMY    . HERNIA REPAIR     Family History:  Family History  Problem Relation Age of Onset  . Depression Maternal Aunt   . Anxiety disorder Maternal Aunt   . Anxiety disorder Maternal Grandmother   . Coronary artery disease Neg Hx      Musculoskeletal: Strength & Muscle Tone: within normal limits Gait & Station: normal Patient leans: no lean  Psychiatric Specialty Exam: HPI  Review of Systems  Constitutional: Negative for fever.  Respiratory: Negative for cough.   Cardiovascular: Negative for chest pain.  Skin: Negative for  itching.  Neurological: Negative for tremors and headaches.  Psychiatric/Behavioral: Positive for depression. Negative for hallucinations and suicidal ideas.    Blood pressure 128/74, pulse 86, resp. rate 16, height 5\' 6"  (1.676 m), weight 235 lb (106.6 kg), SpO2 98 %.Body mass index is 37.93 kg/m.  General Appearance: Casual  Eye Contact:  Fair  Speech:  Normal Rate  Volume:  Normal  Mood: less depressed  Affect: fair and reactive today  Thought Process:  Coherent  Orientation:  Full (Time, Place, and Person)  Thought Content:  Rumination  Suicidal  Thoughts:  No  Homicidal Thoughts:  No  Memory:  Immediate;   Fair Recent;   Fair  Judgement:  Fair  Insight:  Shallow  Psychomotor Activity:  Normal  Concentration:  Fair  Recall:  [place]: Fair  Akathisia:  Negative  Handed:  Right  AIMS (if indicated):    Assets:  Communication Skills Desire for Improvement Financial Resources/Insurance  ADL's:  Intact  Cognition: WNL  Sleep:  Below average with frequent awakenings   Is the patient at risk to self?  No.  Allergies:   Allergies  Allergen Reactions  . Celebrex [Celecoxib] Itching  . Codeine     REACTION: hives  . Morphine And Related   . Celebrex [Celecoxib] Rash   Current Medications: Current Outpatient Prescriptions  Medication Sig Dispense Refill  . ALPRAZolam (XANAX) 0.5 MG tablet Take 1 tablet (0.5 mg total) by mouth daily. 30 tablet 0  . cyclobenzaprine (FLEXERIL) 10 MG tablet Take 1 tablet (10 mg total) by mouth 3 (three) times daily as needed for muscle spasms. 90 tablet 1  . diclofenac (VOLTAREN) 75 MG EC tablet Take 1 tablet (75 mg total) by mouth 2 (two) times daily. 60 tablet 2  . doxycycline (VIBRA-TABS) 100 MG tablet Take 1 tablet (100 mg total) by mouth 2 (two) times daily. 14 tablet 0  . estrogens, conjugated, (PREMARIN) 0.625 MG tablet Take 0.625 mg by mouth.    . fluconazole (DIFLUCAN) 150 MG tablet Take 1 tablet (150 mg total) by mouth every 3 (three) days as needed. 2 tablet 0  . FLUoxetine (PROZAC) 20 MG tablet Take 2 tablets (40 mg total) by mouth daily. 60 tablet 0  . gabapentin (NEURONTIN) 600 MG tablet TAKE ONE TABLET BY MOUTH THREE TIMES DAILY 90 tablet 1  . Linaclotide (LINZESS) 290 MCG CAPS capsule Take 290 mcg by mouth daily.    . mupirocin ointment (BACTROBAN) 2 % Place 1 application into the nose 2 (two) times daily. 22 g 0  . topiramate (TOPAMAX) 25 MG tablet TAKE ONE TABLET BY MOUTH AT BEDTIME 30 tablet 2  . traZODone (DESYREL) 100 MG tablet Take 1 tablet  (100 mg total) by mouth at bedtime. 30 tablet 0  . venlafaxine XR (EFFEXOR-XR) 75 MG 24 hr capsule TAKE ONE CAPSULE BY MOUTH ONCE DAILY WITH BREAKFAST 30 capsule 0   No current facility-administered medications for this visit.       Treatment Plan Summary: MDD continue effexor 75mg . And  prozac 40mg    on topomax to loose weight Panic disorder and  GAD. Continue xanax 0.5 mg   Continue prozac and has improved some.  Avoid regular use of xanax once anxiety somewhat better With her current status of extreme anxiety and feeling of helplessness a motivational ride down homework not to be off work until she is stable to return Possible join back work in one week Recommend psychotherapy and list  of providers provided.  Insomnia;  Continue trazadone Also discussed sleep hygiene, cut down caffeine. Can call refill for trazadone and xanax when ready  More than 50% time spent in counseling and coordination of care including patient education including her current stressor of nephew .  call 911 or ED visit for any urgent need or suicidal tougths. Follow up in 4 weeks    Time spent: 25 minutes    Mary Cox 9/14/201710:49 AM

## 2016-05-17 ENCOUNTER — Telehealth (HOSPITAL_COMMUNITY): Payer: Self-pay | Admitting: *Deleted

## 2016-05-17 ENCOUNTER — Telehealth (HOSPITAL_COMMUNITY): Payer: Self-pay | Admitting: Psychiatry

## 2016-05-17 NOTE — Telephone Encounter (Signed)
Patient request another work note for her to be out on disability. Pt would like the work note for 9/21-10/21.   Patient states she does not feel like she is ready.  Please advise.

## 2016-05-17 NOTE — Telephone Encounter (Signed)
Fax received from [facility] for disability, claim [id number]. Documents completed and faxed to [phone]. Confirmation received.

## 2016-05-18 NOTE — Telephone Encounter (Signed)
Return telephone call to pt. Informed pt, per Dr. De Nurse, pt will need to return to work on 05/18/16. Per Dr. De Nurse, pt was seen on 05/11/16, pt showed improvement and return to work date was discussed. Pt shows understanding. Pt f/u appt is schedule on 10/12.

## 2016-05-18 NOTE — Telephone Encounter (Signed)
As per last visit, discussion and improvement. We have sent note earlier to be return work today.

## 2016-05-19 ENCOUNTER — Encounter (HOSPITAL_COMMUNITY): Payer: Self-pay | Admitting: *Deleted

## 2016-05-19 ENCOUNTER — Telehealth (HOSPITAL_COMMUNITY): Payer: Self-pay | Admitting: *Deleted

## 2016-05-19 NOTE — Telephone Encounter (Signed)
Pt would like a return to work sent to [employer] at [phone]. Pt return to work on 05/18/16.

## 2016-05-22 ENCOUNTER — Encounter (HOSPITAL_COMMUNITY): Payer: Self-pay | Admitting: *Deleted

## 2016-05-22 ENCOUNTER — Telehealth: Payer: Self-pay | Admitting: Family Medicine

## 2016-05-22 NOTE — Telephone Encounter (Signed)
Scheduled appt for ED follow up

## 2016-05-23 ENCOUNTER — Ambulatory Visit: Payer: Managed Care, Other (non HMO) | Admitting: Family Medicine

## 2016-05-23 ENCOUNTER — Telehealth: Payer: Self-pay | Admitting: Family Medicine

## 2016-05-23 NOTE — Telephone Encounter (Signed)
appt rescheduled for ED follow up  Pt notified

## 2016-05-24 ENCOUNTER — Encounter: Payer: Self-pay | Admitting: Family Medicine

## 2016-05-24 ENCOUNTER — Ambulatory Visit: Payer: Managed Care, Other (non HMO) | Admitting: Physician Assistant

## 2016-05-25 ENCOUNTER — Other Ambulatory Visit: Payer: Self-pay | Admitting: Family Medicine

## 2016-05-29 ENCOUNTER — Ambulatory Visit (INDEPENDENT_AMBULATORY_CARE_PROVIDER_SITE_OTHER): Payer: 59 | Admitting: Licensed Clinical Social Worker

## 2016-05-29 DIAGNOSIS — F411 Generalized anxiety disorder: Secondary | ICD-10-CM | POA: Diagnosis not present

## 2016-05-29 DIAGNOSIS — F331 Major depressive disorder, recurrent, moderate: Secondary | ICD-10-CM

## 2016-05-29 DIAGNOSIS — F410 Panic disorder [episodic paroxysmal anxiety] without agoraphobia: Secondary | ICD-10-CM | POA: Diagnosis not present

## 2016-05-29 DIAGNOSIS — G4700 Insomnia, unspecified: Secondary | ICD-10-CM

## 2016-05-29 NOTE — Progress Notes (Signed)
   THERAPIST PROGRESS NOTE  Session Time: 3:05 PM to 3:55 PM  Participation Level: Active  Behavioral Response: CasualAlertEuthymic  Type of Therapy: Individual Therapy  Treatment Goals addressed:  decrease depressive symptoms, decrease anxiety symptoms and panic, challenged unhelpful thought  Interventions: Solution Focused, Supportive and Other: Coping strategies to manage anxiety  Summary: Mary Cox is a 35 y.o. female who presents with frustrated with sister's lack of proactive approach to helping her nephew with his medical issues. Shared that she does not think sister is seeing the big picture. She recognizes that The biggest thing is accepting things that she can't change. Went back to work for two days. She hurt her arm so she didn't go back to work but related that she went back and it still the same so she does not think she is ready to go back. She described anxiety attacks at work and symptoms of heart out of rhythm. She gets panic just leaving the house. He relates that mom comes to the house and she pushes back the curtains and pushes her to do things. Recognizes she is depressed and even though she does not feel like doing anything she knows that it's good for her. He feels that if she wouldn't talk to her nephew that may help settle her nerves. She feels she is done everything she can to help that may help her anxiety related to his medical issues. Described terrible focus and discussed with therapist thought she could be ADD but also discussed how we'll concentration comes from mental health symptoms. Completed treatment plan with therapist and recognizes one of the things she has to work on is finding ways to let go.   Suicidal/Homicidal: No  Therapist Response: Work with patient on developing treatment plan and that will include coping strategies to improve depression, strategies to decrease panic symptoms, strategies to help with worry and challenged unhelpful thoughts.  Encouraged patient with her own ecognition that she has to learn to let go of things she cannot change as a very useful strategy for managing anxiety. Discussed that therapeutic interventions will include challenging cognitions, behavioral strategies and strategies to de-escalate physical symptoms. Provided positive feedback related to patient's mom encouraging her to be active. Discussed DBT strategy of not following action urge but that doing the opposite will help change one's emotional reaction. Encouraged patient in strategy of taking one thing at a time in terms of addressing symptom of lack of focus and also is terms of problem solving for her mental health symptoms. Helped patient process emotions around her stressors and provided emotional support.   Plan: Return again in 2 weeks.2. Patient will engage in problem solving by going to see her nephew to see what she can do to help with his medical issues.3. Patient to set up appointment with Dr. to discuss issues with going back to work.4. Patient work with therapist on learning coping strategies to manage emotions.  Diagnosis: Axis I:  major depressive disorder, recurrent, moderate, generalized anxiety disorder, panic disorder, insomnia    Axis II: No diagnosis    Theresea Trautmann A, LCSW 05/29/2016

## 2016-06-02 ENCOUNTER — Encounter (HOSPITAL_COMMUNITY): Payer: Self-pay | Admitting: Psychiatry

## 2016-06-02 ENCOUNTER — Ambulatory Visit (INDEPENDENT_AMBULATORY_CARE_PROVIDER_SITE_OTHER): Payer: 59 | Admitting: Psychiatry

## 2016-06-02 DIAGNOSIS — F410 Panic disorder [episodic paroxysmal anxiety] without agoraphobia: Secondary | ICD-10-CM

## 2016-06-02 DIAGNOSIS — F411 Generalized anxiety disorder: Secondary | ICD-10-CM

## 2016-06-02 DIAGNOSIS — G4700 Insomnia, unspecified: Secondary | ICD-10-CM | POA: Diagnosis not present

## 2016-06-02 DIAGNOSIS — F331 Major depressive disorder, recurrent, moderate: Secondary | ICD-10-CM | POA: Diagnosis not present

## 2016-06-02 MED ORDER — FLUOXETINE HCL 20 MG PO TABS: 40.0000 mg | ORAL_TABLET | Freq: Every day | ORAL | 0 refills | Status: DC

## 2016-06-02 MED ORDER — VENLAFAXINE HCL ER 75 MG PO CP24: ORAL_CAPSULE | ORAL | 0 refills | Status: DC

## 2016-06-02 MED ORDER — BUSPIRONE HCL 10 MG PO TABS: 10.0000 mg | ORAL_TABLET | Freq: Two times a day (BID) | ORAL | 0 refills | Status: DC

## 2016-06-02 NOTE — Progress Notes (Signed)
Patient ID: Mary Cox, female   DOB: Jan 19, 1981, 35 y.o.   MRN: [id number]  [facility] Outpatient Follow up visit  Patient Identification: Mary Cox MRN:  [id number] Date of Evaluation:  06/02/2016 Referral Source: Primary care [facility] Chief Complaint:    Visit Diagnosis:    ICD-9-CM ICD-10-CM   1. Major depressive disorder, recurrent episode, moderate (HCC) 296.32 F33.1   2. GAD (generalized anxiety disorder) 300.02 F41.1   3. Panic disorder 300.01 F41.0   4. Insomnia, unspecified type 780.52 G47.00    Diagnosis:   Patient Active Problem List   Diagnosis Date Noted  . Ulcer of abdomen wall with fat layer exposed ([place]) [L98.492] 04/07/2016  . H/O ovarian cancer [Z85.43] 04/07/2016  . Pain in joint of right shoulder [M25.511] 11/02/2015  . Major depressive disorder, recurrent episode, moderate ([place]) [F33.1] 02/25/2015  . Panic disorder [F41.0] 02/25/2015  . Biliary calculus with cholecystitis [K80.10] 10/14/2014  . Adult BMI 30+ [IMO0002] 09/25/2014  . GERD (gastroesophageal reflux disease) [K21.9] 12/05/2012  . Generalized anxiety disorder [F41.1] 12/05/2012  . HYPERSOMNIA WITH SLEEP APNEA UNSPECIFIED [G47.10, G47.30] 09/15/2010   History of Present Illness:  Patient is a 35  years old currently separated Caucasian female living by herself . Initially presented  With depression, anxiety.   Patient is going through stress because of her husband giving hard time and not giving more than child support is taking away the car off her son.  Patient Remains stressed since her son does not want to go to school and wants to do home schooling. She remains upset down anxious she worries a lot disturbed sleep cannot function without proper amount of sleep gets out of focus distracted since she cannot go to work because she is unable to concentrate and gets overwhelmed with poor sleep and panic attacks.   Working third shift effects sleep  Worries about her nephew having brain tumor. Her  job, her son and finances.  Panic attacks; more  frequent Depression: worsen Insomnia:trazadone doesn't work. Feels xanax doesn't work on anxiety No drugs or alcohol use GAD: worries are gone high Tolerating medications.   Severity of depression: 6 /10. 10 being happy.  Aggravating factors; split from her husband  In 2015  after 46 years of marriage. Job stress  Modifying factors; her son 5  years of age.     Psychotic Symptoms:  denies PTSD Symptoms: NA  Past Medical History:  Past Medical History:  Diagnosis Date  . Anxiety    [facility], dr watts  . Anxiety   . Cancer ([place])   . Chest pain   . Depression   . Epigastric pain   . GERD (gastroesophageal reflux disease)   . Ovarian cancer ([place])   . Sleep apnea     Past Surgical History:  Procedure Laterality Date  . ABDOMINAL HYSTERECTOMY    . APPENDECTOMY    . HERNIA REPAIR     Family History:  Family History  Problem Relation Age of Onset  . Depression Maternal Aunt   . Anxiety disorder Maternal Aunt   . Anxiety disorder Maternal Grandmother   . Coronary artery disease Neg Hx      Musculoskeletal: Strength & Muscle Tone: within normal limits Gait & Station: normal Patient leans: no lean  Psychiatric Specialty Exam: HPI  Review of Systems  Constitutional: Negative for fever.  Respiratory: Negative for cough.   Cardiovascular: Negative for chest pain.  Skin: Negative for itching.  Neurological: Negative for tremors  and headaches.  Psychiatric/Behavioral: Positive for depression. Negative for hallucinations and suicidal ideas. The patient is nervous/anxious.     There were no vitals taken for this visit.There is no height or weight on file to calculate BMI.  General Appearance: Casual  Eye Contact:  Fair  Speech:  Normal Rate  Volume:  Normal  Mood:  depressed  Affect: down  Thought Process:  Coherent  Orientation:  Full (Time, Place, and Person)  Thought Content:  Rumination   Suicidal Thoughts:  No  Homicidal Thoughts:  No  Memory:  Immediate;   Fair Recent;   Fair  Judgement:  Fair  Insight:  Shallow  Psychomotor Activity:  Normal  Concentration:  Fair  Recall:  Mary Cox: Fair  Akathisia:  Negative  Handed:  Right  AIMS (if indicated):    Assets:  Communication Skills Desire for Improvement Financial Resources/Insurance  ADL's:  Intact  Cognition: WNL  Sleep:  Below average with frequent awakenings   Is the patient at risk to self?  No.  Allergies:   Allergies  Allergen Reactions  . Celebrex [Celecoxib] Itching  . Codeine     REACTION: hives  . Morphine And Related   . Celebrex [Celecoxib] Rash   Current Medications: Current Outpatient Prescriptions  Medication Sig Dispense Refill  . busPIRone (BUSPAR) 10 MG tablet Take 1 tablet (10 mg total) by mouth 2 (two) times daily. 60 tablet 0  . cyclobenzaprine (FLEXERIL) 10 MG tablet TAKE ONE TABLET BY MOUTH THREE TIMES DAILY AS NEEDED FOR MUSCLE SPASM 90 tablet 1  . diclofenac (VOLTAREN) 75 MG EC tablet Take 1 tablet (75 mg total) by mouth 2 (two) times daily. 60 tablet 2  . doxycycline (VIBRA-TABS) 100 MG tablet Take 1 tablet (100 mg total) by mouth 2 (two) times daily. 14 tablet 0  . estrogens, conjugated, (PREMARIN) 0.625 MG tablet Take 0.625 mg by mouth.    . fluconazole (DIFLUCAN) 150 MG tablet Take 1 tablet (150 mg total) by mouth every 3 (three) days as needed. 2 tablet 0  . FLUoxetine (PROZAC) 20 MG tablet Take 2 tablets (40 mg total) by mouth daily. 60 tablet 0  . gabapentin (NEURONTIN) 600 MG tablet TAKE ONE TABLET BY MOUTH THREE TIMES DAILY 90 tablet 1  . Linaclotide (LINZESS) 290 MCG CAPS capsule Take 290 mcg by mouth daily.    . mupirocin ointment (BACTROBAN) 2 % Place 1 application into the nose 2 (two) times daily. 22 g 0  . topiramate (TOPAMAX) 25 MG tablet TAKE ONE TABLET BY MOUTH AT BEDTIME 30 tablet 2  . venlafaxine XR (EFFEXOR-XR) 75 MG 24 hr  capsule Bid 60 capsule 0   No current facility-administered medications for this visit.       Treatment Plan Summary: MDD Increase effexor 75 mg bid Continue prozac 40mg  for depression and anxiety  on topomax to loose weight Panic disorder and  GAD.    Continue prozac and effexor. Stop xanax. Start buspirone 10mg  bid Explained side effects   With her current status of extreme anxiety and feeling of helplessness a motivational will write down to remain off work till next Friday.  Possible join back work in one week Recommend psychotherapy and list of providers provided.  Insomnia; reviewed sleep hygiene.    More than 50% time spent in counseling and coordination of care including patient education including her current stressor of nephew .  call 911 or ED visit for any urgent need or suicidal  tougths. Follow up in 4 weeks    Time spent: 25 minutes    Orpah Hausner 10/6/201712:39 PM

## 2016-06-08 ENCOUNTER — Ambulatory Visit (HOSPITAL_COMMUNITY): Payer: Self-pay | Admitting: Psychiatry

## 2016-06-19 ENCOUNTER — Ambulatory Visit (HOSPITAL_COMMUNITY): Payer: Self-pay | Admitting: Licensed Clinical Social Worker

## 2016-07-03 ENCOUNTER — Ambulatory Visit (HOSPITAL_COMMUNITY): Payer: Self-pay | Admitting: Psychiatry

## 2016-07-10 ENCOUNTER — Ambulatory Visit (HOSPITAL_COMMUNITY): Payer: Self-pay | Admitting: Licensed Clinical Social Worker

## 2016-07-16 ENCOUNTER — Other Ambulatory Visit: Payer: Self-pay | Admitting: Family Medicine

## 2016-07-16 ENCOUNTER — Other Ambulatory Visit (HOSPITAL_COMMUNITY): Payer: Self-pay | Admitting: Psychiatry

## 2016-07-18 NOTE — Telephone Encounter (Signed)
Received medication refill request for Buspar and Effexor from [facility]. Per Dr. De Nurse, medication refill is denied. Pt "no show" apt on 11/6. lvm for pt to contact office to reschedule apt.

## 2016-07-24 ENCOUNTER — Other Ambulatory Visit: Payer: Self-pay | Admitting: Family Medicine

## 2016-07-25 ENCOUNTER — Other Ambulatory Visit (HOSPITAL_COMMUNITY): Payer: Self-pay | Admitting: Psychiatry

## 2016-07-26 NOTE — Telephone Encounter (Signed)
Received medication refill request for Buspar and Effexor from [facility]. Per Dr. De Nurse, medication refills are denied. Pt "no show" apt on 11/6. lvm for pt to contact office to reschedule apt.

## 2016-07-29 ENCOUNTER — Other Ambulatory Visit (HOSPITAL_COMMUNITY): Payer: Self-pay | Admitting: Psychiatry

## 2016-08-08 ENCOUNTER — Telehealth (HOSPITAL_COMMUNITY): Payer: Self-pay | Admitting: *Deleted

## 2016-08-08 NOTE — Telephone Encounter (Signed)
Pt lvm requesting a refill for Effexor XR 75mg . Pt was last seen on 10/6, no showed apt on 11/6. Per Dr. De Nurse, pt will need to schedule an apt for refill will be issue. Lvm for pt to contact office to schedule apt.

## 2016-08-09 NOTE — Telephone Encounter (Signed)
Pt lvm requesting a refill for Effexor XR 75mg . Pt was last seen on 10/6, no showed apt on 11/6. Per Dr. De Nurse, pt will need to schedule an apt for refill will be issue. Lvm for pt to contact office to schedule apt.

## 2016-08-17 ENCOUNTER — Ambulatory Visit (HOSPITAL_COMMUNITY): Payer: Self-pay | Admitting: Psychiatry

## 2016-08-18 ENCOUNTER — Ambulatory Visit (HOSPITAL_COMMUNITY): Payer: Self-pay | Admitting: Psychiatry

## 2016-08-18 ENCOUNTER — Encounter (HOSPITAL_COMMUNITY): Payer: Self-pay | Admitting: Psychiatry

## 2016-08-25 ENCOUNTER — Other Ambulatory Visit (HOSPITAL_COMMUNITY): Payer: Self-pay | Admitting: *Deleted

## 2016-08-25 MED ORDER — VENLAFAXINE HCL ER 75 MG PO CP24: ORAL_CAPSULE | ORAL | 0 refills | Status: DC

## 2016-08-25 MED ORDER — BUSPIRONE HCL 10 MG PO TABS: 10.0000 mg | ORAL_TABLET | Freq: Two times a day (BID) | ORAL | 0 refills | Status: DC

## 2016-08-25 NOTE — Telephone Encounter (Signed)
Medication refill- pt lvm on 08/24/16 requesting refills for Effexor and Buspar. Pt was discharge from practice on 08/18/16 due to "no show" policy. Per Dr. De Nurse, 1 refill is authorize for Effexor 75mg , #60 and Buspar 10mg , #60. Prescriptions were sent to pharmacy.  Please informed pt no more refills will be issued. Lvm informing pt of refill status.

## 2016-09-24 ENCOUNTER — Other Ambulatory Visit: Payer: Self-pay | Admitting: Family Medicine

## 2016-09-24 ENCOUNTER — Other Ambulatory Visit (HOSPITAL_COMMUNITY): Payer: Self-pay | Admitting: Psychiatry

## 2016-09-26 ENCOUNTER — Telehealth: Payer: Self-pay | Admitting: Family Medicine

## 2016-09-26 NOTE — Telephone Encounter (Signed)
Medication refill- pt lvm on 09/26/16 requesting refills for Effexor and Buspar. Pt was discharge from practice on 08/18/16 due to "no show" policy. Per Dr. De Nurse, Refills were sent to [facility] on 08/24/16 for authorize for Effexor 75mg , #60 and Buspar 10mg , #60. Lvm on phone number listed in chart on 08/24/16 informing pt no more refills will be issued.  Contact pt today to inform pt of refill status. Pt disconnected the telephone line. Attempted to contact pt again, no answer.

## 2016-09-28 ENCOUNTER — Ambulatory Visit: Payer: Managed Care, Other (non HMO) | Admitting: Family Medicine

## 2016-09-28 ENCOUNTER — Ambulatory Visit (INDEPENDENT_AMBULATORY_CARE_PROVIDER_SITE_OTHER): Payer: Managed Care, Other (non HMO) | Admitting: Family Medicine

## 2016-09-28 ENCOUNTER — Encounter: Payer: Self-pay | Admitting: Family Medicine

## 2016-09-28 VITALS — BP 132/69 | HR 89 | Temp 97.3°F | Ht 66.0 in | Wt 243.6 lb

## 2016-09-28 DIAGNOSIS — F411 Generalized anxiety disorder: Secondary | ICD-10-CM | POA: Diagnosis not present

## 2016-09-28 MED ORDER — ALPRAZOLAM 0.5 MG PO TABS: 0.5000 mg | ORAL_TABLET | Freq: Every day | ORAL | 0 refills | Status: DC | PRN

## 2016-09-28 MED ORDER — BUSPIRONE HCL 10 MG PO TABS: 10.0000 mg | ORAL_TABLET | Freq: Two times a day (BID) | ORAL | 3 refills | Status: DC

## 2016-09-28 MED ORDER — VENLAFAXINE HCL ER 75 MG PO CP24: ORAL_CAPSULE | ORAL | 3 refills | Status: DC

## 2016-09-28 NOTE — Progress Notes (Signed)
   HPI  Patient presents today here with anxiety for refills.  Patient explains that recently released by her psychiatrist for no shows. She did not realize she missed so many appointments.  She is doing okay on Effexor and BuSpar. She uses Xanax occasionally, she has used 30 pills in the last 3 months.  She denies any drug use, she states that she drinks only a small amount alcohol socially.  She is interested in getting another psychiatrist    PMH: Smoking status noted ROS: Per HPI  Objective: BP 132/69   Pulse 89   Temp 97.3 F (36.3 C) (Oral)   Ht 5\' 6"  (1.676 m)   Wt 243 lb 9.6 oz (110.5 kg)   BMI 39.32 kg/m  Gen: NAD, alert, cooperative with exam HEENT: NCAT, EOMI, PERRL CV: RRR, good S1/S2, no murmur Resp: CTABL, no wheezes, non-labored Abd: SNTND, BS present, no guarding or organomegaly Ext: No edema, warm Neuro: Alert and oriented, No gross deficits  Depression screen [facility] 2/9 09/28/2016 04/11/2016 04/07/2016 11/10/2015 10/15/2015  Decreased Interest 0 0 0 0 0  Down, Depressed, Hopeless 0 0 0 0 0  PHQ - 2 Score 0 0 0 0 0  Altered sleeping - - - - -  Tired, decreased energy - - - - -  Change in appetite - - - - -  Feeling bad or failure about yourself  - - - - -  Trouble concentrating - - - - -  Moving slowly or fidgety/restless - - - - -  Suicidal thoughts - - - - -  PHQ-9 Score - - - - -  Difficult doing work/chores - - - - -   No SI     Assessment and plan:  # GAD Doing ok, some exacerbation after running out of effexor Refill Effexor + Buspar Discussed sparingly using xanax- refilled, #30, R0 Recommended Psych, Given list, Pt seems to like the idea of [facility] F/u 3 months unless following with psych  Meds ordered this encounter  Medications  . DISCONTD: ALPRAZolam (XANAX) 0.5 MG tablet    Sig: Take 0.5 mg by mouth at bedtime as needed for anxiety.  Marland Kitchen venlafaxine XR (EFFEXOR-XR) 75 MG 24 hr capsule    Sig: Bid    Dispense:  60 capsule   Refill:  3  . busPIRone (BUSPAR) 10 MG tablet    Sig: Take 1 tablet (10 mg total) by mouth 2 (two) times daily.    Dispense:  60 tablet    Refill:  3  . ALPRAZolam (XANAX) 0.5 MG tablet    Sig: Take 1 tablet (0.5 mg total) by mouth daily as needed for anxiety.    Dispense:  30 tablet    Refill:  0    Laroy Apple, MD [facility] 09/28/2016, 3:26 PM

## 2016-09-28 NOTE — Patient Instructions (Signed)
Great to see you!  Your provider wants you to schedule an appointment with a Psychologist/Psychiatrist. The following list of offices requires the patient to call and make their own appointment, as there is information they need that only you can provide. Please feel free to choose form the following providers:  [facility] in [place]  [place]  [phone] [place], [place]  (Scheduled through [place]) Must call and do an interview for appointment. Sees Children / Accepts Medicaid  [facility] in [place]  [address] [place]    [place], [place]  [phone] [place], [place] for Autism but does not treat it Sees Children / Accepts Medicaid  [facility]    [phone] [address] [place], [place] Medication management, substance abuse, bipolar, grief, family, marriage, OCD, anxiety, PTSD Sees children / Accepts Medicaid  [facility]    [phone] [address], [place], [place] children / Accepts [facility]  [facility]  [phone] [address] [place], [place]   Dr Lorenza Evangelist     [phone] [address] [place], [place]  Sees ADD & ADHD for treatment Accepts Medicaid  [place]  [phone] [address] Arlean Hopping, [facility] Accepts [facility]  [facility]   [phone] [place], [place]  Does Adult ADD evaluations Does not accept Medicaid  [facility]   [phone] [place], [place] therapy  Sees children as young as 32 years old Accepts Medicaid

## 2016-10-26 ENCOUNTER — Other Ambulatory Visit: Payer: Self-pay | Admitting: Family Medicine

## 2016-10-27 ENCOUNTER — Other Ambulatory Visit (HOSPITAL_COMMUNITY): Payer: Self-pay | Admitting: Psychiatry

## 2016-10-27 NOTE — Telephone Encounter (Signed)
This will have to wait until he returns because it looks as though he did not want her taking it this frequently.

## 2016-10-27 NOTE — Telephone Encounter (Signed)
Last filled and seen 09/28/16. Route to pool for call in

## 2016-10-30 NOTE — Telephone Encounter (Signed)
Needs appt for refill of controlled substance.   Laroy Apple, MD [facility] 10/30/2016, 11:26 AM

## 2016-10-30 NOTE — Telephone Encounter (Signed)
Appointment made 11/03/16.

## 2016-10-30 NOTE — Telephone Encounter (Signed)
Medication refill- received fax from [facility] requesting a refill for Xanax. Pt was discharge from practice on 08/18/16 due to "no show" policy. Per Dr. De Nurse, Refills were sent to [facility] on 08/24/16. Lvm on phone number listed in chart on 08/24/16 informing pt no more refills will be issued. Nothing further is needed at this time.

## 2016-11-03 ENCOUNTER — Ambulatory Visit: Payer: Self-pay | Admitting: Family Medicine

## 2016-11-10 ENCOUNTER — Ambulatory Visit (INDEPENDENT_AMBULATORY_CARE_PROVIDER_SITE_OTHER): Payer: Managed Care, Other (non HMO) | Admitting: Family Medicine

## 2016-11-10 ENCOUNTER — Encounter: Payer: Self-pay | Admitting: Family Medicine

## 2016-11-10 VITALS — BP 127/87 | HR 85 | Temp 97.7°F | Ht 66.0 in | Wt 252.2 lb

## 2016-11-10 DIAGNOSIS — M545 Low back pain, unspecified: Secondary | ICD-10-CM

## 2016-11-10 DIAGNOSIS — F411 Generalized anxiety disorder: Secondary | ICD-10-CM

## 2016-11-10 MED ORDER — ALPRAZOLAM 0.5 MG PO TABS: 0.5000 mg | ORAL_TABLET | Freq: Every day | ORAL | 1 refills | Status: AC | PRN

## 2016-11-10 NOTE — Progress Notes (Signed)
   HPI  Patient presents today follow-up for anxiety, also with acute came plate of back pain.  Anxiety Stable Xanax being taken nearly everyday, she states that she's been taking this for many months, however [place] controlled substance database shows only 1 fill before I refill last month.  Back pain Bilateral back pain, been going on off and on for about one month Patient has chronic bilateral thigh numbness that's from an ovarian cancer surgery. She takes Neurontin for this. She's been taking Advil, Voltaren, and Tylenol with intermittent help. Declines physical therapy. No bowel or bladder dysfunction, saddle anesthesia, leg weakness    PMH: Smoking status noted ROS: Per HPI  Objective: BP 127/87   Pulse 85   Temp 97.7 F (36.5 C) (Oral)   Ht 5\' 6"  (1.676 m)   Wt 252 lb 3.2 oz (114.4 kg)   BMI 40.71 kg/m  Gen: NAD, alert, cooperative with exam HEENT: NCAT, EOMI, PERRL CV: RRR, good S1/S2, no murmur Resp: CTABL, no wheezes, non-labored Ext: No edema, warm Neuro: Alert and oriented, strength 5/5 and sensation intact in bilateral lower extremities, 2+ patellar reflexes bilaterally  MSK:  TTP over BL SI joints, No midline tenderness  Assessment and plan:  # Anxiety Stable, refilled Xanax 2 months, encouraged her to call psychiatry. Would recommend an consider reducing benzodiazepines if she is unwilling to go ahead and try to establish care with psychiatry. Continue Effexor  # Back pain- new problem Most likely SI joint dysfunction, exercises recommended, handout from sports medicine patient visor given. Offered physical therapy which she declines   Meds ordered this encounter  Medications  . ALPRAZolam (XANAX) 0.5 MG tablet    Sig: Take 1 tablet (0.5 mg total) by mouth daily as needed for anxiety.    Dispense:  30 tablet    Refill:  [place], MD [place] 11/10/2016, 4:40 PM

## 2016-11-10 NOTE — Patient Instructions (Signed)
Great to see you!   

## 2016-11-16 ENCOUNTER — Encounter: Payer: Self-pay | Admitting: Family Medicine

## 2016-11-20 ENCOUNTER — Telehealth: Payer: Self-pay | Admitting: Family Medicine

## 2016-11-20 NOTE — Telephone Encounter (Signed)
Attempted to contact patient and no answer  

## 2016-11-20 NOTE — Telephone Encounter (Incomplete)
What type of referral do you need? For her back  Have you been seen at our office for this problem? Yes  (If no, schedule them an appointment.  They will need to be seen before a referral can be done.)  Is there a particular doctor or location that you prefer? ***  Patient notified that referrals can take up to a week or longer to process. If they haven't heard anything within a week they should call back and speak with the referral department.

## 2016-12-31 IMAGING — CR DG SHOULDER 2+V*R*
3 series · 3 of 3 positions shown · non-contrast
Comparison: Right shoulder series of November 14, 2014

CLINICAL DATA: Shoulder pain without history of trauma

EXAM:
RIGHT SHOULDER - 2+ VIEW

[view not recorded (1 of 3)]
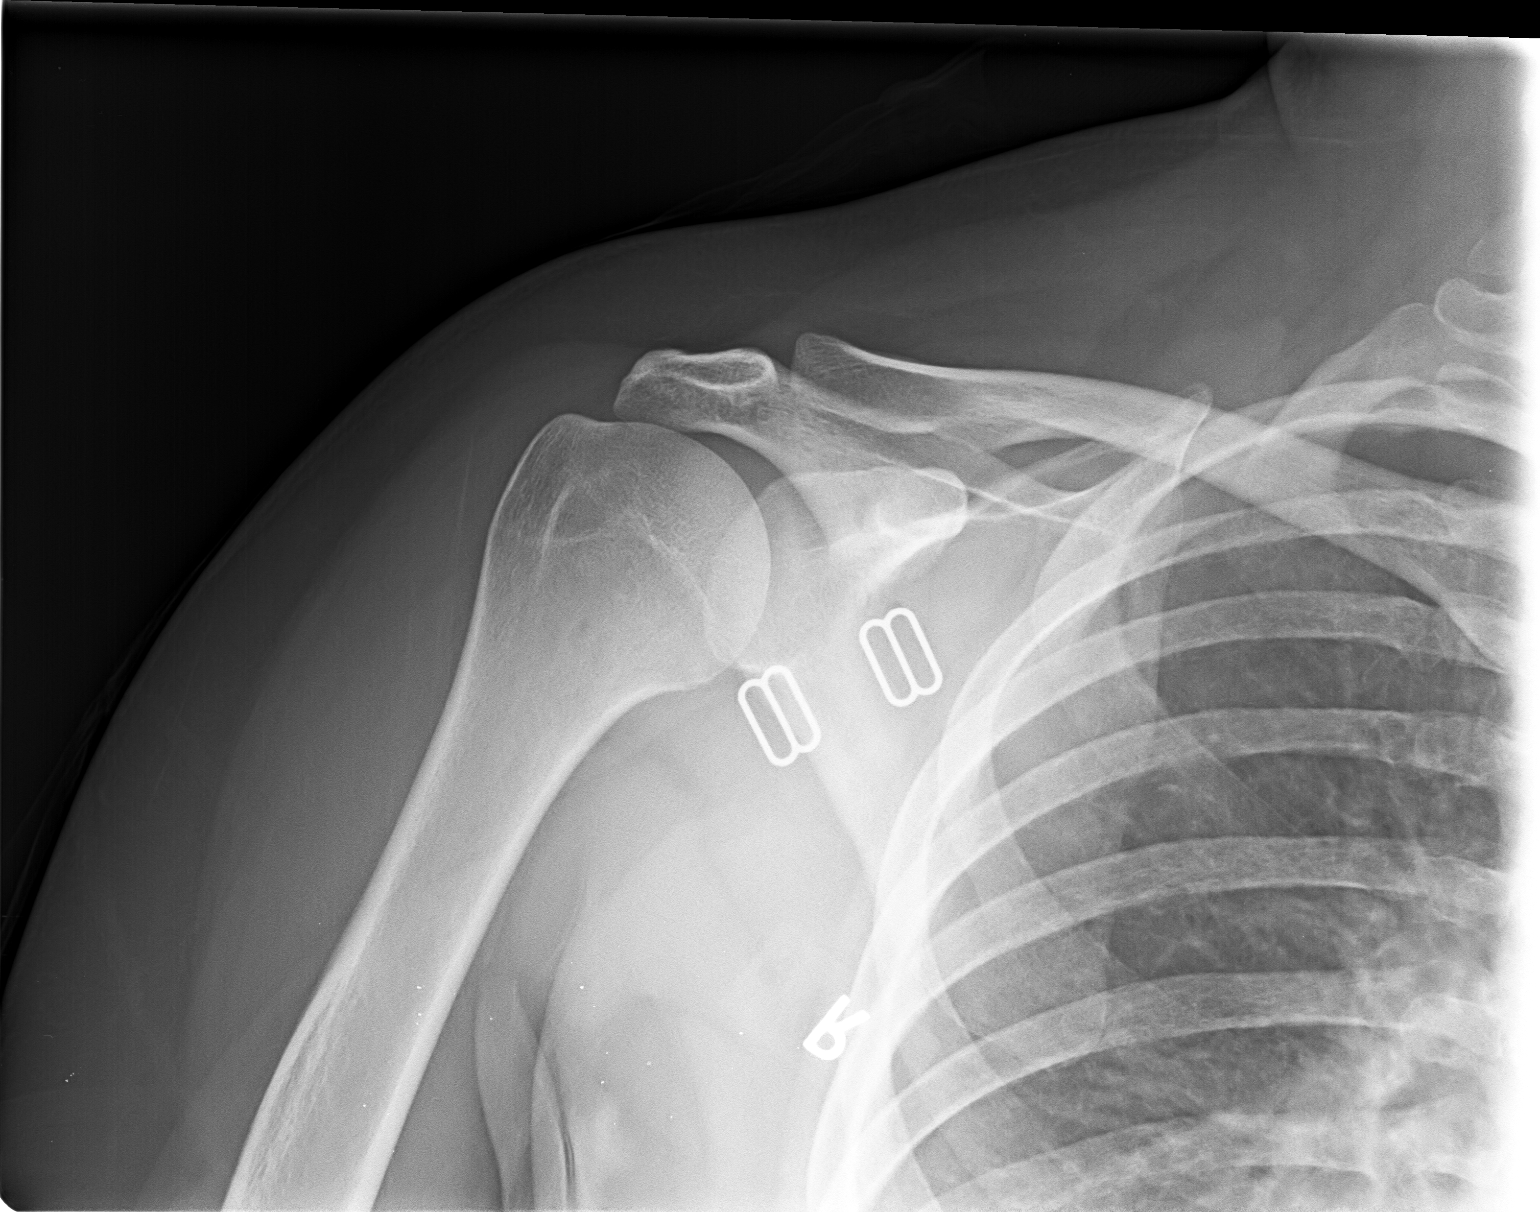

[view not recorded (2 of 3)]
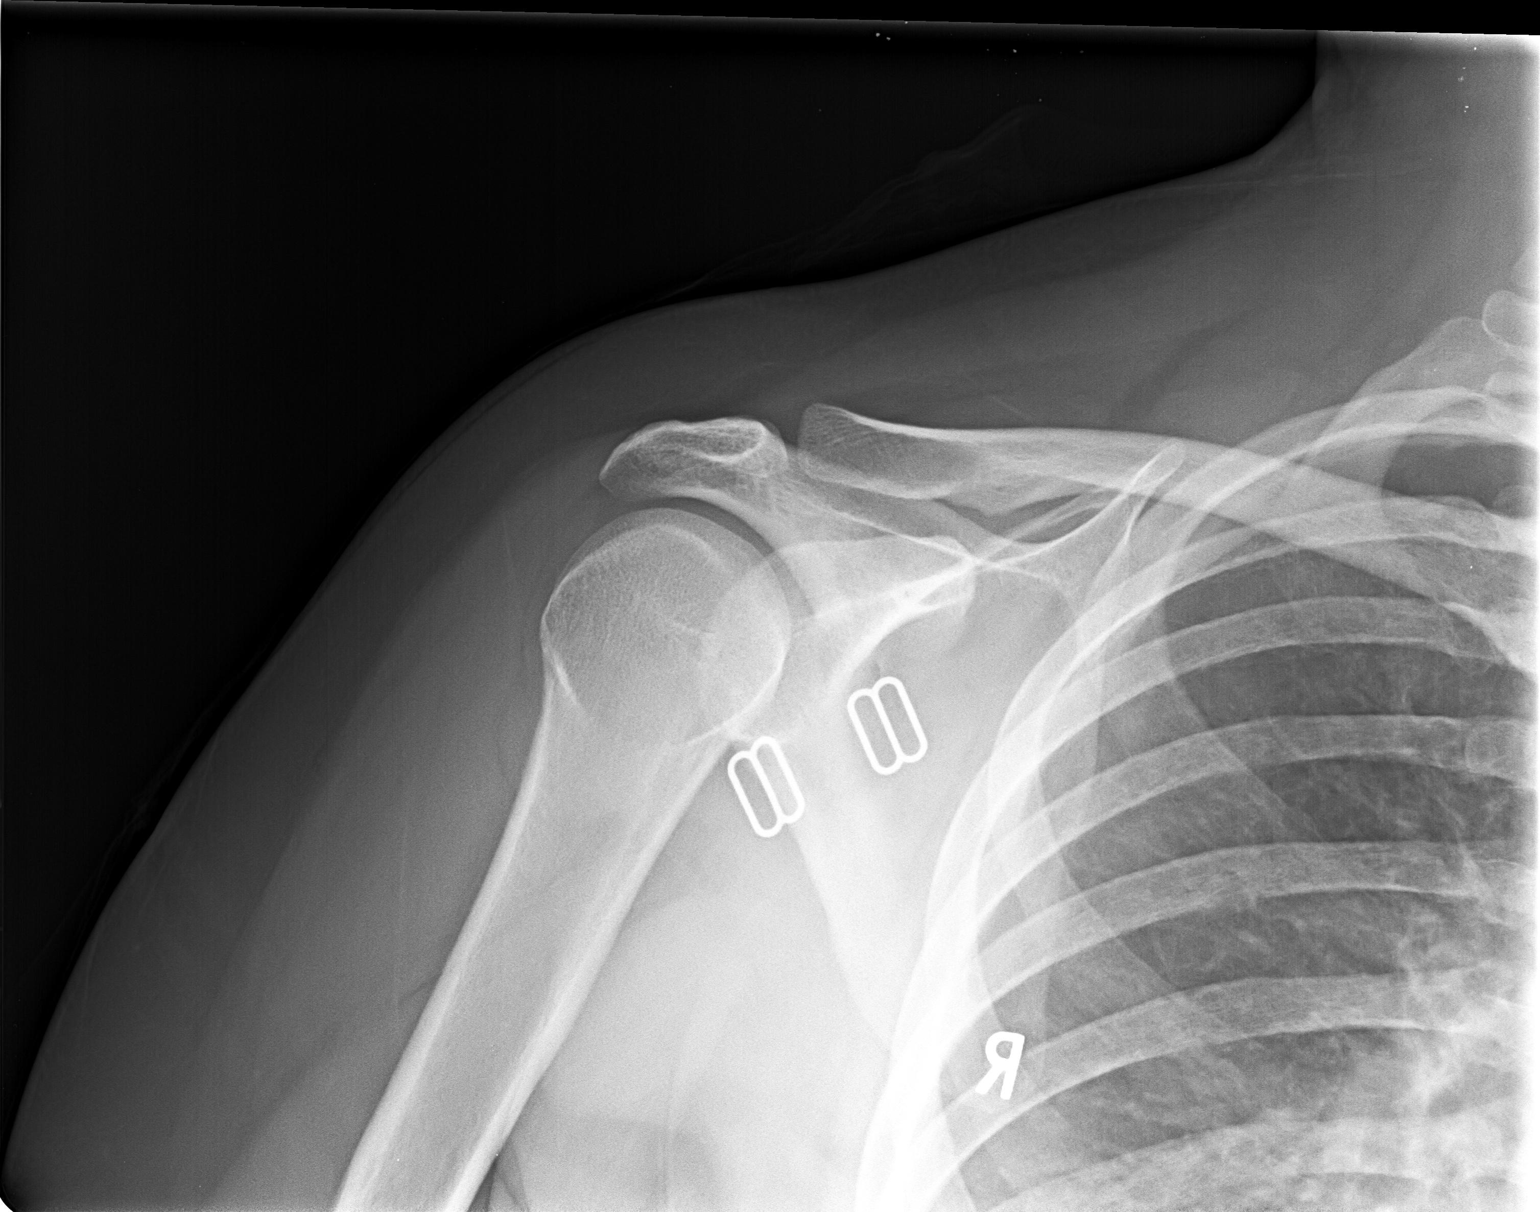

[view not recorded (3 of 3)]
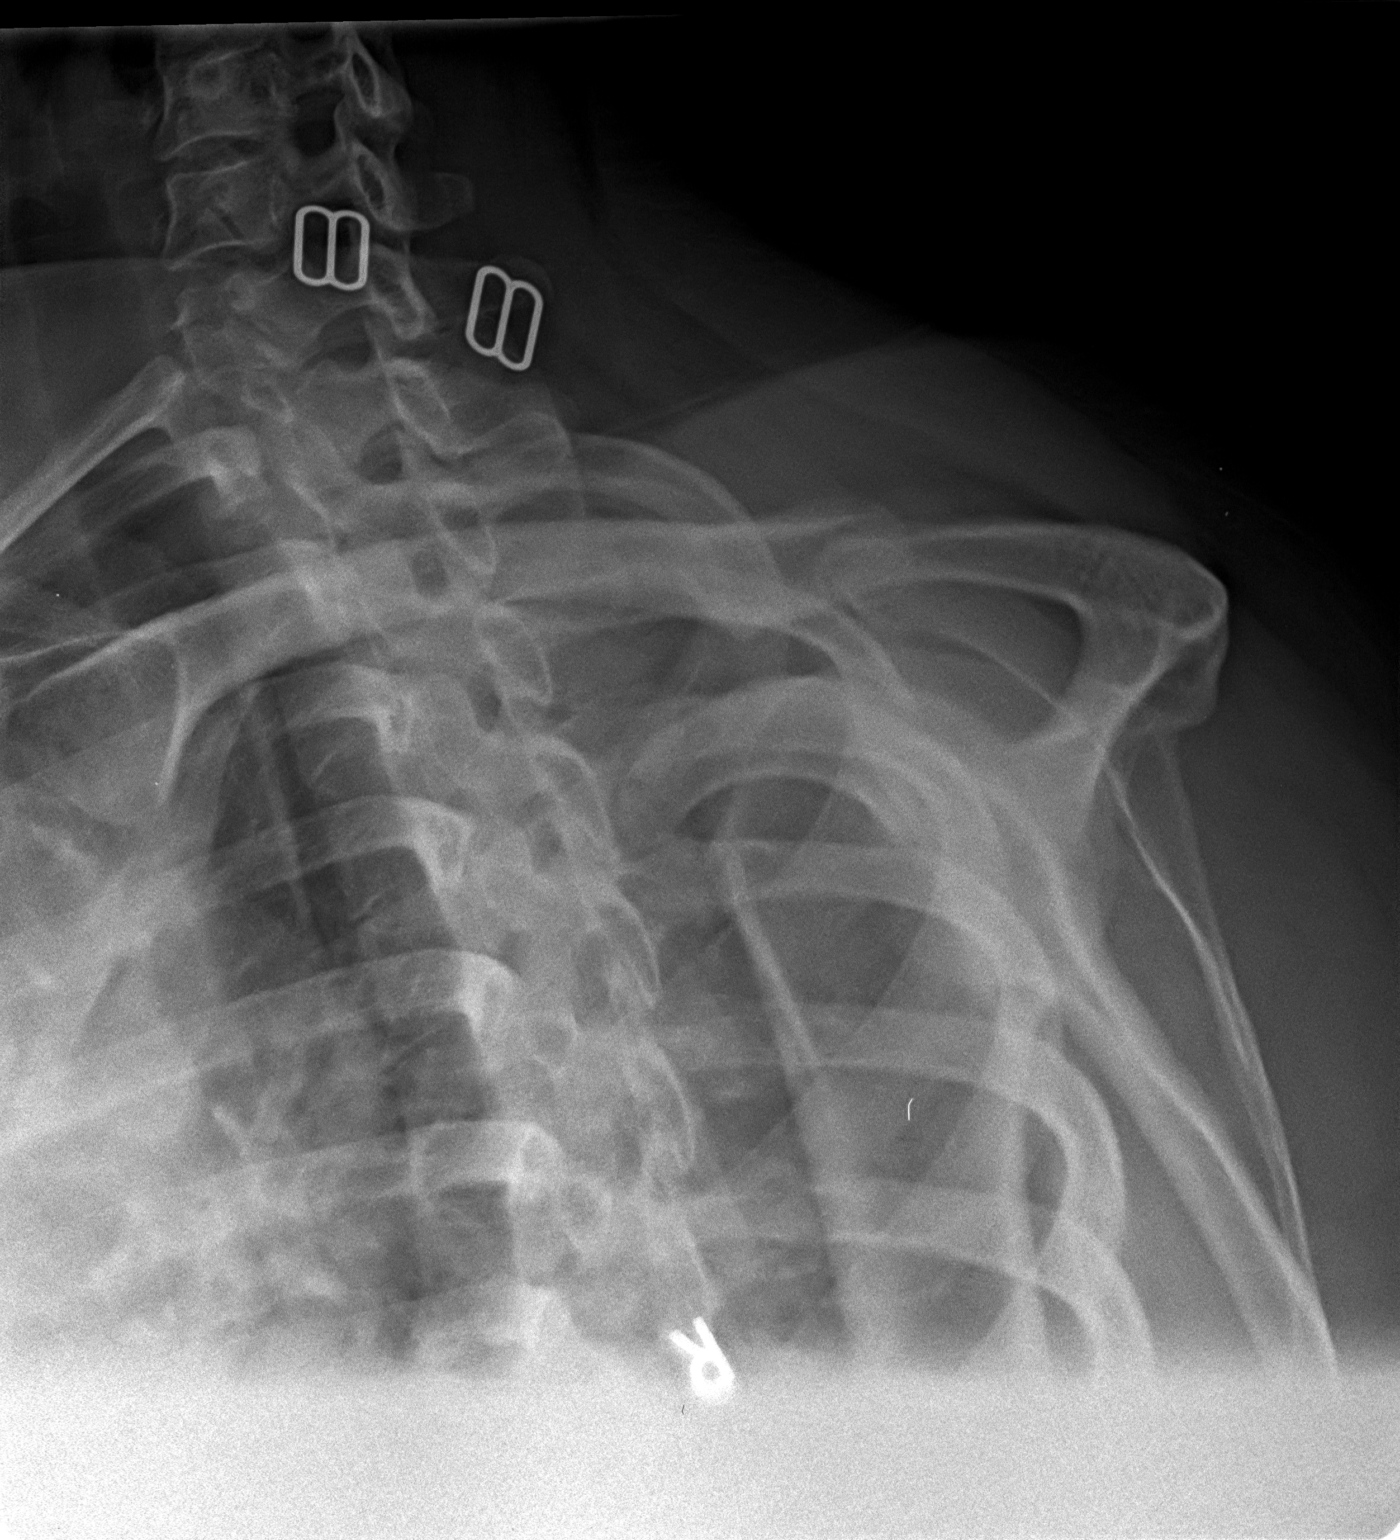

[3 of 3 positions shown; findings below may reference images not displayed]

FINDINGS: The bones are adequately mineralized. The joint spaces are
preserved. There is no significant osteophyte formation. The
observed portions of the right clavicle and upper right ribs are
normal. The soft tissues are unremarkable.
IMPRESSION: There is no acute or chronic bony abnormality of the right shoulder.

## 2017-01-08 ENCOUNTER — Other Ambulatory Visit: Payer: Self-pay | Admitting: Family Medicine

## 2017-10-20 ENCOUNTER — Other Ambulatory Visit: Payer: Self-pay

## 2017-10-20 ENCOUNTER — Encounter (HOSPITAL_COMMUNITY): Payer: Self-pay

## 2017-10-20 ENCOUNTER — Ambulatory Visit (HOSPITAL_COMMUNITY)
Admission: EM | Admit: 2017-10-20 | Discharge: 2017-10-20 | Disposition: A | Discharge status: Home / Self Care | Payer: BLUE CROSS/BLUE SHIELD | Attending: Family Medicine | Admitting: Family Medicine

## 2017-10-20 DIAGNOSIS — M25512 Pain in left shoulder: Secondary | ICD-10-CM

## 2017-10-20 DIAGNOSIS — F4323 Adjustment disorder with mixed anxiety and depressed mood: Secondary | ICD-10-CM

## 2017-10-20 MED ORDER — VENLAFAXINE HCL ER 150 MG PO CP24: 150.0000 mg | ORAL_CAPSULE | Freq: Every day | ORAL | 0 refills | Status: AC

## 2017-10-20 NOTE — Discharge Instructions (Signed)
Heat (pad or rice pillow in microwave) over affected area, 10-15 minutes twice daily.   Ice/cold pack over area for 10-15 min twice daily.  EXERCISES  RANGE OF MOTION (ROM) AND STRETCHING EXERCISES These exercises may help you when beginning to rehabilitate your injury. While completing these exercises, remember:  Restoring tissue flexibility helps normal motion to return to the joints. This allows healthier, less painful movement and activity. An effective stretch should be held for at least 30 seconds. A stretch should never be painful. You should only feel a gentle lengthening or release in the stretched tissue.  ROM - Pendulum Bend at the waist so that your right / left arm falls away from your body. Support yourself with your opposite hand on a solid surface, such as a table or a countertop. Your right / left arm should be perpendicular to the ground. If it is not perpendicular, you need to lean over farther. Relax the muscles in your right / left arm and shoulder as much as possible. Gently sway your hips and trunk so they move your right / left arm without any use of your right / left shoulder muscles. Progress your movements so that your right / left arm moves side to side, then forward and backward, and finally, both clockwise and counterclockwise. Complete 10-15 repetitions in each direction. Many people use this exercise to relieve discomfort in their shoulder as well as to gain range of motion. Repeat 2 times. Complete this exercise 3 times per week.  STRETCH - Flexion, Standing Stand with good posture. With an underhand grip on your right / left hand and an overhand grip on the opposite hand, grasp a broomstick or cane so that your hands are a little more than shoulder-width apart. Keeping your right / left elbow straight and shoulder muscles relaxed, push the stick with your opposite hand to raise your right / left arm in front of your body and then overhead. Raise your arm until  you feel a stretch in your right / left shoulder, but before you have increased shoulder pain. Try to avoid shrugging your right / left shoulder as your arm rises by keeping your shoulder blade tucked down and toward your mid-back spine. Hold 30 seconds. Slowly return to the starting position. Repeat 2 times. Complete this exercise 3 times per week.  STRETCH - Internal Rotation Place your right / left hand behind your back, palm-up. Throw a towel or belt over your opposite shoulder. Grasp the towel/belt with your right / left hand. While keeping an upright posture, gently pull up on the towel/belt until you feel a stretch in the front of your right / left shoulder. Avoid shrugging your right / left shoulder as your arm rises by keeping your shoulder blade tucked down and toward your mid-back spine. Hold 30. Release the stretch by lowering your opposite hand. Repeat 2 times. Complete this exercise 3 times per week.  STRETCH - External Rotation and Abduction Stagger your stance through a doorframe. It does not matter which foot is forward. As instructed by your physician, physical therapist or athletic trainer, place your hands: And forearms above your head and on the door frame. And forearms at head-height and on the door frame. At elbow-height and on the door frame. Keeping your head and chest upright and your stomach muscles tight to prevent over-extending your low-back, slowly shift your weight onto your front foot until you feel a stretch across your chest and/or in the front of your shoulders.  Hold 30 seconds. Shift your weight to your back foot to release the stretch. Repeat 2 times. Complete this stretch 3 times per week.   STRENGTHENING EXERCISES  These exercises may help you when beginning to rehabilitate your injury. They may resolve your symptoms with or without further involvement from your physician, physical therapist or athletic trainer. While completing these exercises,  remember:  Muscles can gain both the endurance and the strength needed for everyday activities through controlled exercises. Complete these exercises as instructed by your physician, physical therapist or athletic trainer. Progress the resistance and repetitions only as guided. You may experience muscle soreness or fatigue, but the pain or discomfort you are trying to eliminate should never worsen during these exercises. If this pain does worsen, stop and make certain you are following the directions exactly. If the pain is still present after adjustments, discontinue the exercise until you can discuss the trouble with your clinician. If advised by your physician, during your recovery, avoid activity or exercises which involve actions that place your right / left hand or elbow above your head or behind your back or head. These positions stress the tissues which are trying to heal.  STRENGTH - Scapular Depression and Adduction With good posture, sit on a firm chair. Supported your arms in front of you with pillows, arm rests or a table top. Have your elbows in line with the sides of your body. Gently draw your shoulder blades down and toward your mid-back spine. Gradually increase the tension without tensing the muscles along the top of your shoulders and the back of your neck. Hold for 3 seconds. Slowly release the tension and relax your muscles completely before completing the next repetition. After you have practiced this exercise, remove the arm support and complete it in standing as well as sitting. Repeat 2 times. Complete this exercise 3 times per week.   STRENGTH - External Rotators Secure a rubber exercise band/tubing to a fixed object so that it is at the same height as your right / left elbow when you are standing or sitting on a firm surface. Stand or sit so that the secured exercise band/tubing is at your side that is not injured. Bend your elbow 90 degrees. Place a folded towel or small  pillow under your right / left arm so that your elbow is a few inches away from your side. Keeping the tension on the exercise band/tubing, pull it away from your body, as if pivoting on your elbow. Be sure to keep your body steady so that the movement is only coming from your shoulder rotating. Hold 3 seconds. Release the tension in a controlled manner as you return to the starting position. Repeat 2 times. Complete this exercise 3 times per week.   STRENGTH - Supraspinatus Stand or sit with good posture. Grasp a 2-3 lb weight or an exercise band/tubing so that your hand is "thumbs-up," like when you shake hands. Slowly lift your right / left hand from your thigh into the air, traveling about 30 degrees from straight out at your side. Lift your hand to shoulder height or as far as you can without increasing any shoulder pain. Initially, many people do not lift their hands above shoulder height. Avoid shrugging your right / left shoulder as your arm rises by keeping your shoulder blade tucked down and toward your mid-back spine. Hold for 3 seconds. Control the descent of your hand as you slowly return to your starting position. Repeat 2 times. Complete  this exercise 3 times per week.   STRENGTH - Shoulder Extensors Secure a rubber exercise band/tubing so that it is at the height of your shoulders when you are either standing or sitting on a firm arm-less chair. With a thumbs-up grip, grasp an end of the band/tubing in each hand. Straighten your elbows and lift your hands straight in front of you at shoulder height. Step back away from the secured end of band/tubing until it becomes tense. Squeezing your shoulder blades together, pull your hands down to the sides of your thighs. Do not allow your hands to go behind you. Hold for 3 seconds. Slowly ease the tension on the band/tubing as you reverse the directions and return to the starting position. Repeat 2 times. Complete this exercise 3 times per  week.   STRENGTH - Scapular Retractors Secure a rubber exercise band/tubing so that it is at the height of your shoulders when you are either standing or sitting on a firm arm-less chair. With a palm-down grip, grasp an end of the band/tubing in each hand. Straighten your elbows and lift your hands straight in front of you at shoulder height. Step back away from the secured end of band/tubing until it becomes tense. Squeezing your shoulder blades together, draw your elbows back as you bend them. Keep your upper arm lifted away from your body throughout the exercise. Hold 3 seconds. Slowly ease the tension on the band/tubing as you reverse the directions and return to the starting position. Repeat 2 times. Complete this exercise 3 times per week.  STRENGTH - Scapular Depressors Find a sturdy chair without wheels, such as a from a dining room table. Keeping your feet on the floor, lift your bottom from the seat and lock your elbows. Keeping your elbows straight, allow gravity to pull your body weight down. Your shoulders will rise toward your ears. Raise your body against gravity by drawing your shoulder blades down your back, shortening the distance between your shoulders and ears. Although your feet should always maintain contact with the floor, your feet should progressively support less body weight as you get stronger. Hold 3 seconds. In a controlled and slow manner, lower your body weight to begin the next repetition. Repeat 2 times. Complete this exercise 3 times per week.    This information is not intended to replace advice given to you by your health care provider. Make sure you discuss any questions you have with your health care provider.   Document Released: 06/28/2005 Document Revised: 09/04/2014 Document Reviewed: 11/26/2008 Elsevier Interactive Patient Education [employer].

## 2017-10-20 NOTE — ED Provider Notes (Signed)
  [facility]    CSN: [id number] Arrival date & time: 10/20/17  1659  Musculoskeletal Exam  Patient: Mary Cox DOB: [birth year]  DOS: 10/20/2017  SUBJECTIVE:  Chief Complaint:   Chief Complaint  Patient presents with  . Medication Refill  . Shoulder Pain    Mary Cox is a 37 y.o.  female for evaluation and treatment of L shoulder pain.   Onset:  3 days ago. No injury or change in activity.  Location: R shoulder Character:  aching  Progression of issue:  is unchanged Associated symptoms: none Treatment: to date has been rest and OTC NSAIDS.   Neurovascular symptoms: no  She has a history of anxiety with depression and has been on Effexor 150 mg daily for 20 years.  She has been off of it for a couple days as she has run out.  She has an appointment on Monday but would like some medicine called in to hold her over.  ROS: Musculoskeletal/Extremities: +L shoulder pain Neuro: No numbness or tingling  Past Medical History:  Diagnosis Date  . Anxiety    [facility], dr watts  . Anxiety   . Cancer ([place])   . Chest pain   . Depression   . Epigastric pain   . GERD (gastroesophageal reflux disease)   . Ovarian cancer ([place])   . Sleep apnea     Objective: VITAL SIGNS: BP 128/83 (BP Location: Right Arm)   Pulse 75   Resp 16   SpO2 98%  Constitutional: Well formed, well developed. No acute distress. Cardiovascular: Brisk cap refill Thorax & Lungs: No accessory muscle use Musculoskeletal: L shoulder.   Normal active range of motion: yes.   Normal passive range of motion: yes Tenderness to palpation: Yes over L deltoid Deformity: no Ecchymosis: no Tests negative: Empty can, Hawkins, liftoff, Neer's, speeds, crossover Neurologic: Normal sensory function. No focal deficits noted. Psychiatric: Normal mood. Age appropriate judgment and insight. Alert & oriented x 3.    Assessment:  Acute pain of left shoulder  Adjustment disorder with  mixed anxiety and depressed mood  Plan: Refill Effexor. Ibuprofen, Tylenol, home stretches/exercises, heat, ice. F/u as originally scheduled w reg PCP. The patient voiced understanding and agreement to the plan.     Shelda Pal, DO 10/20/17 2230

## 2017-10-20 NOTE — ED Triage Notes (Signed)
Pt presents today with needing a refill of her effexor. States she has been having withdrawal sxs. Head feels funny and has been vomiting. Has not been on it for 3-4 days and has been trying to get a refill from her PCP at [facility] in [place] but they will not send it in. Also has had so left shoulder pain that just started within the last several days. States it is tender to touch and would like it checked out.

## 2017-11-16 ENCOUNTER — Other Ambulatory Visit: Payer: Self-pay | Admitting: Family Medicine
